# Patient Record
Sex: Female | Born: 1960 | Race: Black or African American | Hispanic: No | Marital: Single | Smoking: Never smoker
Health system: Southern US, Community
[De-identification: ages and names within clinical notes are randomized; demographics above are authoritative.]

## PROBLEM LIST (undated history)

## (undated) DIAGNOSIS — N83209 Unspecified ovarian cyst, unspecified side: Secondary | ICD-10-CM

## (undated) DIAGNOSIS — Z460 Encounter for fitting and adjustment of spectacles and contact lenses: Secondary | ICD-10-CM

## (undated) DIAGNOSIS — M549 Dorsalgia, unspecified: Secondary | ICD-10-CM

## (undated) DIAGNOSIS — D219 Benign neoplasm of connective and other soft tissue, unspecified: Secondary | ICD-10-CM

## (undated) DIAGNOSIS — N920 Excessive and frequent menstruation with regular cycle: Secondary | ICD-10-CM

## (undated) DIAGNOSIS — N840 Polyp of corpus uteri: Secondary | ICD-10-CM

## (undated) DIAGNOSIS — R5383 Other fatigue: Secondary | ICD-10-CM

## (undated) DIAGNOSIS — B009 Herpesviral infection, unspecified: Secondary | ICD-10-CM

## (undated) DIAGNOSIS — B9689 Other specified bacterial agents as the cause of diseases classified elsewhere: Secondary | ICD-10-CM

## (undated) DIAGNOSIS — N760 Acute vaginitis: Secondary | ICD-10-CM

## (undated) HISTORY — DX: Excessive and frequent menstruation with regular cycle: N92.0

## (undated) HISTORY — DX: Other specified bacterial agents as the cause of diseases classified elsewhere: N76.0

## (undated) HISTORY — DX: Dorsalgia, unspecified: M54.9

## (undated) HISTORY — DX: Acute vaginitis: B96.89

## (undated) HISTORY — DX: Encounter for fitting and adjustment of spectacles and contact lenses: Z46.0

## (undated) HISTORY — DX: Polyp of corpus uteri: N84.0

## (undated) HISTORY — DX: Other fatigue: R53.83

## (undated) HISTORY — DX: Herpesviral infection, unspecified: B00.9

## (undated) HISTORY — DX: Benign neoplasm of connective and other soft tissue, unspecified: D21.9

## (undated) HISTORY — PX: MYOMECTOMY: SHX85

## (undated) HISTORY — DX: Unspecified ovarian cyst, unspecified side: N83.209

---

## 1996-08-11 HISTORY — PX: DILATION AND CURETTAGE OF UTERUS: SHX78

## 1998-08-11 HISTORY — PX: ABDOMINAL HYSTERECTOMY: SHX81

## 1998-09-27 ENCOUNTER — Ambulatory Visit (HOSPITAL_COMMUNITY): Admission: RE | Admit: 1998-09-27 | Discharge: 1998-09-27 | Payer: Self-pay | Admitting: Obstetrics and Gynecology

## 1998-09-27 ENCOUNTER — Encounter: Payer: Self-pay | Admitting: Obstetrics and Gynecology

## 1999-04-22 ENCOUNTER — Inpatient Hospital Stay (HOSPITAL_COMMUNITY): Admission: RE | Admit: 1999-04-22 | Discharge: 1999-04-24 | Payer: Self-pay | Admitting: Obstetrics and Gynecology

## 2000-08-18 ENCOUNTER — Ambulatory Visit (HOSPITAL_BASED_OUTPATIENT_CLINIC_OR_DEPARTMENT_OTHER): Admission: RE | Admit: 2000-08-18 | Discharge: 2000-08-18 | Payer: Self-pay | Admitting: Surgery

## 2000-12-23 ENCOUNTER — Emergency Department (HOSPITAL_COMMUNITY): Admission: EM | Admit: 2000-12-23 | Discharge: 2000-12-23 | Payer: Self-pay | Admitting: Emergency Medicine

## 2000-12-23 ENCOUNTER — Encounter: Payer: Self-pay | Admitting: Internal Medicine

## 2001-10-06 ENCOUNTER — Other Ambulatory Visit: Admission: RE | Admit: 2001-10-06 | Discharge: 2001-10-06 | Payer: Self-pay | Admitting: Obstetrics and Gynecology

## 2002-06-07 ENCOUNTER — Ambulatory Visit (HOSPITAL_COMMUNITY): Admission: RE | Admit: 2002-06-07 | Discharge: 2002-06-07 | Payer: Self-pay | Admitting: Obstetrics and Gynecology

## 2002-06-07 ENCOUNTER — Encounter: Payer: Self-pay | Admitting: Obstetrics and Gynecology

## 2002-06-21 ENCOUNTER — Ambulatory Visit (HOSPITAL_COMMUNITY): Admission: RE | Admit: 2002-06-21 | Discharge: 2002-06-21 | Payer: Self-pay | Admitting: Obstetrics and Gynecology

## 2002-06-21 ENCOUNTER — Encounter: Payer: Self-pay | Admitting: Obstetrics and Gynecology

## 2002-09-26 ENCOUNTER — Other Ambulatory Visit: Admission: RE | Admit: 2002-09-26 | Discharge: 2002-09-26 | Payer: Self-pay | Admitting: Obstetrics and Gynecology

## 2002-11-17 ENCOUNTER — Encounter (INDEPENDENT_AMBULATORY_CARE_PROVIDER_SITE_OTHER): Payer: Self-pay | Admitting: *Deleted

## 2002-11-17 ENCOUNTER — Ambulatory Visit (HOSPITAL_COMMUNITY): Admission: RE | Admit: 2002-11-17 | Discharge: 2002-11-17 | Payer: Self-pay | Admitting: Obstetrics and Gynecology

## 2003-09-27 ENCOUNTER — Other Ambulatory Visit: Admission: RE | Admit: 2003-09-27 | Discharge: 2003-09-27 | Payer: Self-pay | Admitting: Obstetrics and Gynecology

## 2003-10-04 ENCOUNTER — Ambulatory Visit (HOSPITAL_COMMUNITY): Admission: RE | Admit: 2003-10-04 | Discharge: 2003-10-04 | Payer: Self-pay | Admitting: Obstetrics and Gynecology

## 2003-10-05 ENCOUNTER — Encounter: Admission: RE | Admit: 2003-10-05 | Discharge: 2003-10-05 | Payer: Self-pay | Admitting: Obstetrics and Gynecology

## 2003-10-10 ENCOUNTER — Ambulatory Visit (HOSPITAL_COMMUNITY): Admission: RE | Admit: 2003-10-10 | Discharge: 2003-10-10 | Payer: Self-pay | Admitting: Obstetrics and Gynecology

## 2003-11-21 ENCOUNTER — Encounter (INDEPENDENT_AMBULATORY_CARE_PROVIDER_SITE_OTHER): Payer: Self-pay | Admitting: *Deleted

## 2003-11-21 ENCOUNTER — Inpatient Hospital Stay (HOSPITAL_COMMUNITY): Admission: RE | Admit: 2003-11-21 | Discharge: 2003-11-23 | Payer: Self-pay | Admitting: Obstetrics and Gynecology

## 2004-04-11 ENCOUNTER — Encounter: Admission: RE | Admit: 2004-04-11 | Discharge: 2004-07-10 | Payer: Self-pay | Admitting: Family Medicine

## 2004-06-24 ENCOUNTER — Ambulatory Visit: Payer: Self-pay | Admitting: Family Medicine

## 2004-08-08 ENCOUNTER — Ambulatory Visit: Payer: Self-pay | Admitting: Family Medicine

## 2005-01-01 ENCOUNTER — Other Ambulatory Visit: Admission: RE | Admit: 2005-01-01 | Discharge: 2005-01-01 | Payer: Self-pay | Admitting: Obstetrics and Gynecology

## 2005-01-21 ENCOUNTER — Ambulatory Visit (HOSPITAL_COMMUNITY): Admission: RE | Admit: 2005-01-21 | Discharge: 2005-01-21 | Payer: Self-pay | Admitting: Obstetrics and Gynecology

## 2005-01-22 ENCOUNTER — Ambulatory Visit: Payer: Self-pay | Admitting: Family Medicine

## 2005-05-05 ENCOUNTER — Ambulatory Visit: Payer: Self-pay | Admitting: Family Medicine

## 2005-12-30 ENCOUNTER — Ambulatory Visit: Payer: Self-pay | Admitting: Family Medicine

## 2006-05-15 ENCOUNTER — Ambulatory Visit (HOSPITAL_COMMUNITY): Admission: RE | Admit: 2006-05-15 | Discharge: 2006-05-15 | Payer: Self-pay | Admitting: Obstetrics and Gynecology

## 2006-05-18 ENCOUNTER — Other Ambulatory Visit: Admission: RE | Admit: 2006-05-18 | Discharge: 2006-05-18 | Payer: Self-pay | Admitting: Obstetrics and Gynecology

## 2006-08-20 ENCOUNTER — Ambulatory Visit: Payer: Self-pay | Admitting: Family Medicine

## 2006-09-25 ENCOUNTER — Ambulatory Visit: Payer: Self-pay | Admitting: Family Medicine

## 2007-01-11 ENCOUNTER — Ambulatory Visit: Payer: Self-pay | Admitting: Internal Medicine

## 2007-01-11 DIAGNOSIS — L0292 Furuncle, unspecified: Secondary | ICD-10-CM | POA: Insufficient documentation

## 2007-01-11 DIAGNOSIS — L0293 Carbuncle, unspecified: Secondary | ICD-10-CM

## 2007-01-11 DIAGNOSIS — N760 Acute vaginitis: Secondary | ICD-10-CM | POA: Insufficient documentation

## 2007-09-24 ENCOUNTER — Ambulatory Visit (HOSPITAL_COMMUNITY): Admission: RE | Admit: 2007-09-24 | Discharge: 2007-09-24 | Payer: Self-pay | Admitting: Obstetrics and Gynecology

## 2008-01-19 ENCOUNTER — Ambulatory Visit: Payer: Self-pay | Admitting: Family Medicine

## 2008-01-19 DIAGNOSIS — K625 Hemorrhage of anus and rectum: Secondary | ICD-10-CM

## 2008-01-19 DIAGNOSIS — Z87898 Personal history of other specified conditions: Secondary | ICD-10-CM

## 2008-02-14 ENCOUNTER — Telehealth (INDEPENDENT_AMBULATORY_CARE_PROVIDER_SITE_OTHER): Payer: Self-pay | Admitting: *Deleted

## 2008-03-16 ENCOUNTER — Ambulatory Visit: Payer: Self-pay | Admitting: Family Medicine

## 2008-03-21 ENCOUNTER — Telehealth (INDEPENDENT_AMBULATORY_CARE_PROVIDER_SITE_OTHER): Payer: Self-pay | Admitting: *Deleted

## 2008-05-30 ENCOUNTER — Telehealth (INDEPENDENT_AMBULATORY_CARE_PROVIDER_SITE_OTHER): Payer: Self-pay | Admitting: *Deleted

## 2008-06-02 ENCOUNTER — Telehealth: Payer: Self-pay | Admitting: Family Medicine

## 2008-06-08 ENCOUNTER — Telehealth (INDEPENDENT_AMBULATORY_CARE_PROVIDER_SITE_OTHER): Payer: Self-pay | Admitting: *Deleted

## 2008-06-09 ENCOUNTER — Ambulatory Visit: Payer: Self-pay | Admitting: Family Medicine

## 2008-06-09 DIAGNOSIS — R030 Elevated blood-pressure reading, without diagnosis of hypertension: Secondary | ICD-10-CM | POA: Insufficient documentation

## 2008-06-12 ENCOUNTER — Telehealth (INDEPENDENT_AMBULATORY_CARE_PROVIDER_SITE_OTHER): Payer: Self-pay | Admitting: *Deleted

## 2008-06-14 ENCOUNTER — Encounter: Payer: Self-pay | Admitting: Family Medicine

## 2008-07-10 ENCOUNTER — Ambulatory Visit: Payer: Self-pay | Admitting: Family Medicine

## 2008-08-08 ENCOUNTER — Ambulatory Visit: Payer: Self-pay | Admitting: Family Medicine

## 2008-11-01 ENCOUNTER — Ambulatory Visit (HOSPITAL_COMMUNITY): Admission: RE | Admit: 2008-11-01 | Discharge: 2008-11-01 | Payer: Self-pay | Admitting: Obstetrics and Gynecology

## 2009-01-18 ENCOUNTER — Ambulatory Visit: Payer: Self-pay | Admitting: Family Medicine

## 2009-01-22 ENCOUNTER — Encounter (INDEPENDENT_AMBULATORY_CARE_PROVIDER_SITE_OTHER): Payer: Self-pay | Admitting: *Deleted

## 2009-02-07 ENCOUNTER — Ambulatory Visit: Payer: Self-pay | Admitting: Family Medicine

## 2009-02-07 LAB — CONVERTED CEMR LAB
OCCULT 2: NEGATIVE
OCCULT 3: NEGATIVE

## 2009-02-08 ENCOUNTER — Encounter (INDEPENDENT_AMBULATORY_CARE_PROVIDER_SITE_OTHER): Payer: Self-pay | Admitting: *Deleted

## 2009-04-24 ENCOUNTER — Ambulatory Visit: Payer: Self-pay | Admitting: Family Medicine

## 2009-04-24 DIAGNOSIS — K219 Gastro-esophageal reflux disease without esophagitis: Secondary | ICD-10-CM | POA: Insufficient documentation

## 2009-04-24 LAB — CONVERTED CEMR LAB
Blood in Urine, dipstick: NEGATIVE
Glucose, Urine, Semiquant: NEGATIVE
Ketones, urine, test strip: NEGATIVE
Nitrite: NEGATIVE
Specific Gravity, Urine: 1.02
pH: 6.5

## 2009-04-30 ENCOUNTER — Telehealth: Payer: Self-pay | Admitting: Family Medicine

## 2009-04-30 LAB — CONVERTED CEMR LAB: Chlamydia, DNA Probe: NEGATIVE

## 2009-12-31 ENCOUNTER — Ambulatory Visit (HOSPITAL_COMMUNITY): Admission: RE | Admit: 2009-12-31 | Discharge: 2009-12-31 | Payer: Self-pay | Admitting: Obstetrics and Gynecology

## 2010-01-04 ENCOUNTER — Encounter: Admission: RE | Admit: 2010-01-04 | Discharge: 2010-01-04 | Payer: Self-pay | Admitting: Obstetrics and Gynecology

## 2010-01-24 ENCOUNTER — Ambulatory Visit: Payer: Self-pay | Admitting: Family Medicine

## 2010-01-24 DIAGNOSIS — M545 Low back pain: Secondary | ICD-10-CM

## 2010-04-02 ENCOUNTER — Ambulatory Visit: Payer: Self-pay | Admitting: Family Medicine

## 2010-04-02 LAB — CONVERTED CEMR LAB
Bilirubin Urine: NEGATIVE
Ketones, urine, test strip: NEGATIVE
Nitrite: NEGATIVE
Urobilinogen, UA: 0.2

## 2010-04-03 ENCOUNTER — Telehealth (INDEPENDENT_AMBULATORY_CARE_PROVIDER_SITE_OTHER): Payer: Self-pay | Admitting: *Deleted

## 2010-04-03 LAB — CONVERTED CEMR LAB
Chlamydia, Swab/Urine, PCR: NEGATIVE
Gardnerella vaginalis: POSITIVE — AB

## 2010-04-16 ENCOUNTER — Ambulatory Visit: Payer: Self-pay | Admitting: Family Medicine

## 2010-04-23 ENCOUNTER — Encounter: Payer: Self-pay | Admitting: Family Medicine

## 2010-04-24 ENCOUNTER — Telehealth (INDEPENDENT_AMBULATORY_CARE_PROVIDER_SITE_OTHER): Payer: Self-pay | Admitting: *Deleted

## 2010-04-24 ENCOUNTER — Encounter: Payer: Self-pay | Admitting: Family Medicine

## 2010-05-08 ENCOUNTER — Telehealth (INDEPENDENT_AMBULATORY_CARE_PROVIDER_SITE_OTHER): Payer: Self-pay | Admitting: *Deleted

## 2010-05-13 ENCOUNTER — Encounter: Payer: Self-pay | Admitting: Family Medicine

## 2010-05-15 ENCOUNTER — Encounter: Payer: Self-pay | Admitting: Family Medicine

## 2010-05-16 ENCOUNTER — Encounter: Payer: Self-pay | Admitting: Family Medicine

## 2010-05-29 ENCOUNTER — Ambulatory Visit: Payer: Self-pay | Admitting: Family Medicine

## 2010-05-31 ENCOUNTER — Telehealth (INDEPENDENT_AMBULATORY_CARE_PROVIDER_SITE_OTHER): Payer: Self-pay | Admitting: *Deleted

## 2010-06-03 ENCOUNTER — Telehealth (INDEPENDENT_AMBULATORY_CARE_PROVIDER_SITE_OTHER): Payer: Self-pay | Admitting: *Deleted

## 2010-06-05 ENCOUNTER — Ambulatory Visit: Payer: Self-pay | Admitting: Family Medicine

## 2010-06-25 ENCOUNTER — Encounter: Admission: RE | Admit: 2010-06-25 | Discharge: 2010-06-25 | Payer: Self-pay | Admitting: Obstetrics and Gynecology

## 2010-06-28 ENCOUNTER — Encounter: Payer: Self-pay | Admitting: Family Medicine

## 2010-07-03 ENCOUNTER — Ambulatory Visit (HOSPITAL_COMMUNITY): Admission: RE | Admit: 2010-07-03 | Discharge: 2010-07-03 | Payer: Self-pay | Admitting: Surgery

## 2010-07-10 ENCOUNTER — Ambulatory Visit: Payer: Self-pay | Admitting: Family Medicine

## 2010-07-10 ENCOUNTER — Encounter
Admission: RE | Admit: 2010-07-10 | Discharge: 2010-09-10 | Payer: Self-pay | Source: Home / Self Care | Attending: Surgery | Admitting: Surgery

## 2010-07-11 ENCOUNTER — Ambulatory Visit (HOSPITAL_COMMUNITY)
Admission: RE | Admit: 2010-07-11 | Discharge: 2010-07-11 | Payer: Self-pay | Source: Home / Self Care | Admitting: Surgery

## 2010-07-22 ENCOUNTER — Ambulatory Visit (HOSPITAL_BASED_OUTPATIENT_CLINIC_OR_DEPARTMENT_OTHER)
Admission: RE | Admit: 2010-07-22 | Discharge: 2010-07-22 | Payer: Self-pay | Source: Home / Self Care | Attending: Surgery | Admitting: Surgery

## 2010-08-09 ENCOUNTER — Ambulatory Visit: Payer: Self-pay | Admitting: Family Medicine

## 2010-08-09 ENCOUNTER — Encounter
Admission: RE | Admit: 2010-08-09 | Discharge: 2010-09-10 | Payer: Self-pay | Source: Home / Self Care | Attending: General Surgery | Admitting: General Surgery

## 2010-08-31 ENCOUNTER — Encounter: Payer: Self-pay | Admitting: Obstetrics and Gynecology

## 2010-09-01 ENCOUNTER — Encounter: Payer: Self-pay | Admitting: Obstetrics and Gynecology

## 2010-09-08 LAB — CONVERTED CEMR LAB
AST: 20 units/L (ref 0–37)
AST: 20 units/L (ref 0–37)
Albumin: 3.6 g/dL (ref 3.5–5.2)
Albumin: 3.8 g/dL (ref 3.5–5.2)
Alkaline Phosphatase: 64 units/L (ref 39–117)
BUN: 10 mg/dL (ref 6–23)
Basophils Absolute: 0 10*3/uL (ref 0.0–0.1)
Basophils Absolute: 0 10*3/uL (ref 0.0–0.1)
Basophils Relative: 0.2 % (ref 0.0–1.0)
Bilirubin, Direct: 0.1 mg/dL (ref 0.0–0.3)
CO2: 29 meq/L (ref 19–32)
Calcium: 8.8 mg/dL (ref 8.4–10.5)
Chloride: 106 meq/L (ref 96–112)
Cholesterol: 178 mg/dL (ref 0–200)
Creatinine, Ser: 0.8 mg/dL (ref 0.4–1.2)
Creatinine, Ser: 0.8 mg/dL (ref 0.4–1.2)
Eosinophils Absolute: 0 10*3/uL (ref 0.0–0.7)
Eosinophils Absolute: 0.1 10*3/uL (ref 0.0–0.7)
GFR calc Af Amer: 100 mL/min
GFR calc non Af Amer: 83 mL/min
GFR calc non Af Amer: 99.38 mL/min (ref >60)
Glucose, Bld: 101 mg/dL — ABNORMAL HIGH (ref 70–99)
HCT: 35.5 % — ABNORMAL LOW (ref 36.0–46.0)
HDL: 46.4 mg/dL (ref >39.00)
HDL: 47 mg/dL (ref >39.0)
Hemoglobin: 12.9 g/dL (ref 12.0–15.0)
Lymphocytes Relative: 29.3 % (ref 12.0–46.0)
MCHC: 34.7 g/dL (ref 30.0–36.0)
MCHC: 34.7 g/dL (ref 30.0–36.0)
MCV: 94.1 fL (ref 78.0–100.0)
Monocytes Absolute: 0.4 10*3/uL (ref 0.1–1.0)
Monocytes Relative: 8.9 % (ref 3.0–12.0)
Neutro Abs: 3.8 10*3/uL (ref 1.4–7.7)
Neutrophils Relative %: 60.2 % (ref 43.0–77.0)
Neutrophils Relative %: 65.4 % (ref 43.0–77.0)
Pap Smear: NORMAL
Platelets: 315 10*3/uL (ref 150.0–400.0)
Platelets: 369 10*3/uL (ref 150–400)
Potassium: 4 meq/L (ref 3.5–5.1)
RDW: 13.1 % (ref 11.5–14.6)
Sodium: 141 meq/L (ref 135–145)
TSH: 1.9 microintl units/mL (ref 0.35–5.50)
Total Bilirubin: 0.6 mg/dL (ref 0.3–1.2)
Total Bilirubin: 0.9 mg/dL (ref 0.3–1.2)
Triglycerides: 35 mg/dL (ref 0–149)
Triglycerides: 58 mg/dL (ref 0.0–149.0)
VLDL: 11.6 mg/dL (ref 0.0–40.0)
VLDL: 7 mg/dL (ref 0–40)

## 2010-09-09 ENCOUNTER — Ambulatory Visit
Admission: RE | Admit: 2010-09-09 | Discharge: 2010-09-09 | Payer: Self-pay | Source: Home / Self Care | Attending: Family Medicine | Admitting: Family Medicine

## 2010-09-10 ENCOUNTER — Telehealth: Payer: Self-pay | Admitting: Family Medicine

## 2010-09-10 NOTE — Letter (Signed)
Summary: [facility]  [facility]   Imported By: Lanelle Bal 05/28/2010 12:59:25  _____________________________________________________________________  External Attachment:    Type:   Image     Comment:   External Document

## 2010-09-10 NOTE — Progress Notes (Signed)
Summary: Forms for surgery 10/21  Phone Note Outgoing Call   Call placed by: Almeta Monas CMA Duncan Dull),  May 31, 2010 3:37 PM Call placed to: Patient Details for Reason: Forms need to be filled out Summary of Call: Left message to call back.... Almeta Monas CMA Duncan Dull)  May 31, 2010 3:37 PM Per Dr.Lowne she can not write a letter for pt to ave surgery because we have not followed the pt for the last six months for weight loss. Initial call taken by: Almeta Monas CMA Duncan Dull),  May 31, 2010 3:38 PM  Follow-up for Phone Call        see new phone note Follow-up by: Almeta Monas CMA Duncan Dull),  June 03, 2010 3:18 PM

## 2010-09-10 NOTE — Letter (Signed)
Summary: [facility]   Imported By: Lanelle Bal 07/15/2010 14:58:49  _____________________________________________________________________  External Attachment:    Type:   Image     Comment:   External Document

## 2010-09-10 NOTE — Letter (Signed)
Summary: Weight Loss Forms/[facility]  Weight Loss Forms/[facility]   Imported By: Lanelle Bal 06/18/2010 13:13:09  _____________________________________________________________________  External Attachment:    Type:   Image     Comment:   External Document

## 2010-09-10 NOTE — Letter (Signed)
Summary: Orthopedic Exam/[facility]  [facility]   Imported By: Lanelle Bal 05/23/2010 09:31:06  _____________________________________________________________________  External Attachment:    Type:   Image     Comment:   External Document

## 2010-09-10 NOTE — Progress Notes (Signed)
Summary: lmtc 8-24  Phone Note Outgoing Call   Details for Reason: Chlamydia and GC Probe Amp, Urine--negative + Trich and BV----Metronidazole 500mg  1 by mouth two times a day for 7 days Summary of Call: left message to call office.............Marland KitchenFelecia Deloach CMA  April 03, 2010 11:19 AM     New/Updated Medications: METRONIDAZOLE 500 MG TABS (METRONIDAZOLE) 1 by mouth two times a day for 7 days Prescriptions: METRONIDAZOLE 500 MG TABS (METRONIDAZOLE) 1 by mouth two times a day for 7 days  #14 x 0   Entered by:   Almeta Monas CMA (AAMA)   Authorized by:   Loreen Freud DO   Signed by:   Almeta Monas CMA (AAMA) on 04/04/2010   Method used:   [facility]  [address] [id number]       [address]       [place], [place]  [address]       Ph: [phone] or [phone]       Fax: [phone]   RxID:   [id number]  Pt aware, [facility] [address]. Adv pt needs to be treated for Trich as well. Pt voiced understanding.              Almeta Monas CMA Duncan Dull)  April 04, 2010 8:13 AM

## 2010-09-10 NOTE — Assessment & Plan Note (Signed)
Summary: back pain//lch   Vital Signs:  Patient profile:   50 year old female Height:      63 inches Weight:      237 pounds BMI:     42.13 Pulse rate:   84 / minute Pulse rhythm:   regular BP sitting:   130 / 80  (left arm) Cuff size:   large  Vitals Entered By: Army Fossa CMA (January 24, 2010 1:52 PM) CC: Pt got up real fast this am felt a catch and her lower mid back is in a lot of pain now., Back Pain   History of Present Illness:       This is a 50 year old woman who presents with Back Pain.  The symptoms began 8-12 hrs ago.  Pt got out of bed this am and felt sharp pain low back.  The patient denies fever, chills, weakness, loss of sensation, fecal incontinence, urinary incontinence, urinary retention, dysuria, rest pain, inability to work, and inability to care for self.  The pain is located in the mid low back.  The pain began at home and suddenly.  The pain is made worse by standing or walking and flexion.    Current Medications (verified): 1)  Valtrex 500 Mg  Tabs (Valacyclovir Hcl) .... Take One Tablet Daily 2)  Flexeril 10 Mg Tabs (Cyclobenzaprine Hcl) .Marland Kitchen.. 1 By Mouth Three Times A Day As Needed 3)  Vicodin Es 7.5-750 Mg Tabs (Hydrocodone-Acetaminophen) .Marland Kitchen.. 1 By Mouth Q6h As Needed  Allergies (verified): No Known Drug Allergies  Past History:  Past medical, surgical, family and social histories (including risk factors) reviewed for relevance to current acute and chronic problems.  Past Medical History: Reviewed history from 01/19/2008 and no changes required. 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 Hysterectomy 4/05 myomectomy 05/1999 Miscarriage 98 and 71  Family History: Reviewed history from 01/19/2008 and no  changes required. Family History Hypertension Family History of CAD Female 1st degree relative at 48yo Family History of Stroke F 1st degree relative <60 MGM--DM PGM--DM MGF--DM  Social History: Reviewed history from 01/19/2008 and no changes required. Occupation: [employer] Divorced Never Smoked Alcohol use-yes Drug use-no Regular exercise-yes  Review of Systems      See HPI  Physical Exam  General:  Well-developed,well-nourished,in no acute distress; alert,appropriate and cooperative throughout examination Neurologic:  strength normal in all extremities, gait normal, and DTRs symmetrical and normal.  + SLR b/l pain with forward bending ---pain better with back bending Psych:  Oriented X3 and normally interactive.     Impression & Recommendations:  Problem # 1:  LOW BACK PAIN, ACUTE (ICD-724.2)  Her updated medication list for this problem includes:    Flexeril 10 Mg Tabs (Cyclobenzaprine hcl) .Marland Kitchen... 1 by mouth three times a day as needed    Vicodin Es 7.5-750 Mg Tabs (Hydrocodone-acetaminophen) .Marland Kitchen... 1 by mouth q6h as needed  Discussed use of moist heat or ice, modified activities, medications, and stretching/strengthening exercises. Back care instructions given. To be seen in 2 weeks if no improvement; sooner if worsening of symptoms.   Complete Medication List: 1)  Valtrex 500 Mg Tabs (Valacyclovir hcl) .... Take one tablet daily 2)  Flexeril 10 Mg Tabs (Cyclobenzaprine hcl) .Marland Kitchen.. 1 by mouth three times a day as needed 3)  Vicodin Es 7.5-750 Mg  Tabs (Hydrocodone-acetaminophen) .Marland Kitchen.. 1 by mouth q6h as needed Prescriptions: VICODIN ES 7.5-750 MG TABS (HYDROCODONE-ACETAMINOPHEN) 1 by mouth q6h as needed  #60 x 0   Entered and Authorized by:   Loreen Freud DO   Signed by:   Loreen Freud DO on 01/24/2010   Method used:   Print then Give to Patient   RxID:   [id number] FLEXERIL 10 MG TABS (CYCLOBENZAPRINE HCL) 1 by mouth three times a day as needed  #30 x 0   Entered and  Authorized by:   Loreen Freud DO   Signed by:   Loreen Freud DO on 01/24/2010   Method used:   Print then Give to Patient   RxID:   [id number]

## 2010-09-10 NOTE — Progress Notes (Signed)
Summary: Information about Forms  Phone Note Outgoing Call   Call placed by: Almeta Monas CMA Duncan Dull),  June 03, 2010 8:37 AM Call placed to: Patient Details for Reason: Forms for surgery Summary of Call: spk with pt and I advised per paper work can not be signed, she needed to be followed for 6 months before we can sign documents. Pt stated this is the first step, the letter and documents is to show that she had been trying in the past and need for her to qualify for the program. she said once we sign the paperwork she will start the program and will have initial visit with them, they will send her back to [place] and she will be seen by Dr.Lowne  who will follow her for the next few months and then they will proceed with the surgery. Pt can't start untill Bariatric ppl give her the initial visit and she can't get the initial visit until Dr.Lowne sends the paperwork and a letter proving she has tried in the past to loose weight. c/b # [id number].  Initial call taken by: Almeta Monas CMA Duncan Dull),  June 03, 2010 8:44 AM  Follow-up for Phone Call        I don't have enough info to do a letter---the only weight loss attempt I have in chart is adipex for 5 months with no weight loss---  that is not enough for ins to pay for surgery---I can do letter but it will not be enough for ins---if she has done a lot more she really needs to discuss this with me in an ov so I can put it all in a letter. Follow-up by: Loreen Freud DO,  June 03, 2010 3:14 PM  Additional Follow-up for Phone Call Additional follow up Details #1::        Left message to call back..... Almeta Monas CMA Duncan Dull)  June 03, 2010 3:23 PM     Additional Follow-up for Phone Call Additional follow up Details #2::    pt aware, appt schedule for tomorrow morning.... Almeta Monas CMA Duncan Dull)  June 04, 2010 12:00 PM  Follow-up by: Almeta Monas CMA Duncan Dull),  June 04, 2010 12:01 PM

## 2010-09-10 NOTE — Progress Notes (Signed)
Summary: Triage; Ongoing Pain  Phone Note Call from Patient Call back at [phone]   Caller: Patient Summary of Call: Patient called and indicated that she called spoke with someone on Monday and indicated that she is no better. Patient said she was told it will take time for her to get better and was also told that her PT referral was in process. Patient called today to inform Dr.Lowne she is no better and still has not heard anything about her referral. Patient set up her own appointment with a chiropractor, they ordered xray of her back, patient will see them today to get results (patient informed to request they send report to Dr.Lowne)  Luster Landsberg (referral coor) informed to f/u on referral**  [facility] [address]  Initial call taken by: Shonna Chock CMA,  April 24, 2010 9:12 AM    Prescriptions: FLEXERIL 10 MG TABS (CYCLOBENZAPRINE HCL) 1 by mouth three times a day as needed  #30 x 0   Entered by:   Shonna Chock CMA   Authorized by:   Loreen Freud DO   Signed by:   Shonna Chock CMA on 04/24/2010   Method used:   [facility]  [address] [id number]       [address]       [place], [place]  [address]       Ph: [phone] or [phone]       Fax: [phone]   RxID:   [id number]

## 2010-09-10 NOTE — Progress Notes (Signed)
Summary: Info Brought by Patient Regarding Weight Loss Program  Info Brought by Patient Regarding Weight Loss Program   Imported By: Lanelle Bal 06/12/2010 12:59:21  _____________________________________________________________________  External Attachment:    Type:   Image     Comment:   External Document

## 2010-09-10 NOTE — Letter (Signed)
Summary: [facility]  [facility]   Imported By: Lanelle Bal 05/28/2010 13:00:08  _____________________________________________________________________  External Attachment:    Type:   Image     Comment:   External Document

## 2010-09-10 NOTE — Assessment & Plan Note (Signed)
Summary: EXTREME BACK PAIN/RH......Susan Lamb   Vital Signs:  Patient profile:   50 year old female Weight:      242.6 pounds Temp:     98.7 degrees F oral Pulse rate:   80 / minute Pulse rhythm:   regular BP sitting:   124 / 76  (left arm)  Vitals Entered By: Almeta Monas CMA Duncan Dull) (April 16, 2010 1:32 PM) CC: c/o of severe back pain getting worse, Back Pain   History of Present Illness:       This is a 50 year old woman who presents with Back Pain.  The symptoms began 3 weeks ago.  Pt had an episode of back pain---no known injury.  Pt pain radiates down R leg to knee.  The patient denies fever, chills, weakness, loss of sensation, fecal incontinence, urinary incontinence, urinary retention, dysuria, rest pain, inability to work, and inability to care for self.  The pain is located in the right low back.  The pain began at home and gradually.  The pain radiates to the right leg below the knee.  The pain is made worse by lying down and activity.  The pain is made better by inactivity.    Current Medications (verified): 1)  Valtrex 500 Mg  Tabs (Valacyclovir Hcl) .... Take One Tablet Daily 2)  Fluconazole 150 Mg Tabs (Fluconazole) .Susan Lamb.. 1 By Mouth Once Daily X1  --May Repeat in 3 Days As Needed 3)  Metronidazole 500 Mg Tabs (Metronidazole) .Susan Lamb.. 1 By Mouth Two Times A Day For 7 Days 4)  Vicodin Es 7.5-750 Mg Tabs (Hydrocodone-Acetaminophen) .Susan Lamb.. 1 By Mouth Q6h As Needed 5)  Flexeril 10 Mg Tabs (Cyclobenzaprine Hcl) .Susan Lamb.. 1 By Mouth Three Times A Day As Needed 6)  Prednisone 10 Mg Tabs (Prednisone) .... 3 By Mouth Once Daily For 3 Days Then 2 By Mouth Once Daily For 3 Days Then 1 By Mouth Once Daily For 3 Days  Allergies (verified): No Known Drug Allergies  Past History:  Past medical, surgical, family and social histories (including risk factors) reviewed for relevance to current acute and chronic problems.  Past Medical History: Reviewed history from 01/19/2008 and no changes  required. 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 Hysterectomy 4/05 myomectomy 05/1999 Miscarriage 98 and 31  Family History: Reviewed history from 01/19/2008 and no changes required. Family History Hypertension Family History of CAD Female 1st degree relative at 48yo Family History of Stroke F 1st degree relative <60 MGM--DM PGM--DM MGF--DM  Social History: Reviewed history from 01/19/2008 and no changes required. Occupation: [employer] Divorced Never Smoked Alcohol use-yes Drug use-no Regular exercise-yes  Review of Systems      See HPI  Physical Exam  General:  Well-developed,well-nourished,in no acute distress; alert,appropriate and cooperative throughout examination Extremities:  No clubbing, cyanosis, edema, or deformity noted with normal full range of motion of all joints.   Neurologic:  alert & oriented X3, cranial nerves II-XII intact, strength normal in all extremities, gait normal, and DTRs symmetrical and normal.   Skin:  Intact without suspicious lesions or rashes Psych:  Cognition and judgment appear intact. Alert and cooperative with normal attention span and concentration. No apparent delusions, illusions, hallucinations   Impression & Recommendations:  Problem # 1:  LOW BACK PAIN, ACUTE (ICD-724.2)  Her updated medication list for  this problem includes:    Vicodin Es 7.5-750 Mg Tabs (Hydrocodone-acetaminophen) .Susan Lamb... 1 by mouth q6h as needed    Flexeril 10 Mg Tabs (Cyclobenzaprine hcl) .Susan Lamb... 1 by mouth three times a day as needed  Orders: Physical Therapy Referral (PT)  Discussed use of moist heat or ice, modified activities, medications, and stretching/strengthening exercises. Back care instructions given. To be seen in  2 weeks if no improvement; sooner if worsening of symptoms.   Complete Medication List: 1)  Valtrex 500 Mg Tabs (Valacyclovir hcl) .... Take one tablet daily 2)  Fluconazole 150 Mg Tabs (Fluconazole) .Susan Lamb.. 1 by mouth once daily x1  --may repeat in 3 days as needed 3)  Metronidazole 500 Mg Tabs (Metronidazole) .Susan Lamb.. 1 by mouth two times a day for 7 days 4)  Vicodin Es 7.5-750 Mg Tabs (Hydrocodone-acetaminophen) .Susan Lamb.. 1 by mouth q6h as needed 5)  Flexeril 10 Mg Tabs (Cyclobenzaprine hcl) .Susan Lamb.. 1 by mouth three times a day as needed 6)  Prednisone 10 Mg Tabs (Prednisone) .... 3 by mouth once daily for 3 days then 2 by mouth once daily for 3 days then 1 by mouth once daily for 3 days Prescriptions: PREDNISONE 10 MG TABS (PREDNISONE) 3 by mouth once daily for 3 days then 2 by mouth once daily for 3 days then 1 by mouth once daily for 3 days  #18 x 0   Entered and Authorized by:   Loreen Freud DO   Signed by:   Loreen Freud DO on 04/16/2010   Method used:   [facility]  [address] [id number]       [address]       [place], [place]  [address]       Ph: [phone] or [phone]       Fax: [phone]   RxID:   [id number] FLEXERIL 10 MG TABS (CYCLOBENZAPRINE HCL) 1 by mouth three times a day as needed  #30 x 0   Entered and Authorized by:   Loreen Freud DO   Signed by:   Loreen Freud DO on 04/16/2010   Method used:   [facility]        [facility]  [address] [id number]       [address]       [place], [place]  [address]       Ph: [phone] or [phone]       Fax: [phone]   RxID:   [id number] VICODIN ES 7.5-750 MG TABS (HYDROCODONE-ACETAMINOPHEN) 1 by mouth q6h as needed  #30 x 0   Entered and Authorized by:   Loreen Freud DO   Signed by:   Loreen Freud DO on 04/16/2010   Method used:   Print then Give to Patient   RxID:   [id number]

## 2010-09-10 NOTE — Assessment & Plan Note (Signed)
Summary: weight check/cbs   Vital Signs:  Patient profile:   50 year old female Weight:      251.2 pounds BMI:     44.66 Pulse rate:   76 / minute Pulse rhythm:   regular BP sitting:   130 / 80  (left arm) Cuff size:   large  Vitals Entered By: Almeta Monas CMA Duncan Dull) (July 10, 2010 9:36 AM) CC: weight check-- needs refills on muscle relaxer   History of Present Illness: Pt here for weight check ---she had boots on and did not want to remove them to weigh.  Pt weighed at home 249.6 with underwear.   Pt started back with trainer on Monday and went to nutritionist this am.  Pt was given pre op diet.      Problems Prior to Update: 1)  Low Back Pain, Acute  (ICD-724.2) 2)  Gerd  (ICD-530.81) 3)  Vaginitis  (ICD-616.10) 4)  Elevated Bp Reading Without Dx Hypertension  (ICD-796.2) 5)  Morbid Obesity  (ICD-278.01) 6)  Rectal Bleeding  (ICD-569.3) 7)  Preventive Health Care  (ICD-V70.0) 8)  Family History of Cad Female 1st Degree Relative <50  (ICD-V17.3) 9)  Genital Herpes, Hx of  (ICD-V13.8) 10)  Boils, Recurrent  (ICD-680.9) 11)  Vaginitis Nos  (ICD-616.10) 12)  Fh of Hx, Family, Diabetes Mellitus  (ICD-V18.0) 13)  Hx, Family, Stroke  (ICD-V17.1)  Medications Prior to Update: 1)  Valtrex 500 Mg  Tabs (Valacyclovir Hcl) .... Take One Tablet Daily 2)  Vicodin Es 7.5-750 Mg Tabs (Hydrocodone-Acetaminophen) .Marland Kitchen.. 1 By Mouth Q6h As Needed 3)  Flexeril 10 Mg Tabs (Cyclobenzaprine Hcl) .Marland Kitchen.. 1 By Mouth Three Times A Day As Needed  Current Medications (verified): 1)  Valtrex 500 Mg  Tabs (Valacyclovir Hcl) .... Take One Tablet Daily 2)  Vicodin Es 7.5-750 Mg Tabs (Hydrocodone-Acetaminophen) .Marland Kitchen.. 1 By Mouth Q6h As Needed 3)  Flexeril 10 Mg Tabs (Cyclobenzaprine Hcl) .Marland Kitchen.. 1 By Mouth Three Times A Day As Needed  Allergies (verified): No Known Drug Allergies  Past History:  Family History: Last updated: 01/19/2008 Family History Hypertension Family History of CAD Female 1st degree  relative at 48yo Family History of Stroke F 1st degree relative <60 MGM--DM PGM--DM MGF--DM  Social History: Last updated: 01/19/2008 Occupation: Community education officer Divorced Never Smoked Alcohol use-yes Drug use-no Regular exercise-yes  Risk Factors: Alcohol Use: <1 (01/18/2009) Caffeine Use: 0 (01/18/2009) Diet: trying to eat healthy (01/18/2009) Exercise: yes (01/18/2009)  Risk Factors: Smoking Status: never (01/18/2009) Passive Smoke Exposure: no (01/19/2008)  Past medical, surgical, family and social histories (including risk factors) reviewed for relevance to current acute and chronic problems.  Past Medical History: Reviewed history from 01/19/2008 and no changes required. 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 Hysterectomy 4/05 myomectomy 05/1999 Miscarriage 98 and 89  Family History: Reviewed history from 01/19/2008 and no changes required. Family History Hypertension Family History of CAD Female 1st degree relative at 48yo Family History of Stroke F 1st degree relative <60 MGM--DM PGM--DM MGF--DM  Social History: Reviewed history from 01/19/2008 and no changes required. Occupation: [employer] Divorced Never Smoked Alcohol use-yes Drug use-no Regular exercise-yes  Review of Systems      See HPI  Physical Exam  General:  Well-developed,well-nourished,in no acute distress; alert,appropriate and cooperative throughout examination Lungs:  Normal respiratory effort, chest  expands symmetrically. Lungs are clear to auscultation, no crackles or wheezes. Heart:  normal rate and no murmur.   Extremities:  No clubbing, cyanosis, edema, or deformity noted with normal full range of motion of all joints.   Psych:  Oriented X3 and normally  interactive.     Impression & Recommendations:  Problem # 1:  MORBID OBESITY (ICD-278.01) Pt is to con't with nutritionist --low carb , high protein diet and con't with trainer---get back into exercising pt needs 1 more visit here before surgery can be scheduled  Ht: 63 (06/05/2010)   Wt: 251.2 (07/10/2010)   BMI: 44.66 (07/10/2010)  Problem # 2:  LOW BACK PAIN, ACUTE (ICD-724.2)  Her updated medication list for this problem includes:    Vicodin Es 7.5-750 Mg Tabs (Hydrocodone-acetaminophen) .Marland Kitchen... 1 by mouth q6h as needed    Flexeril 10 Mg Tabs (Cyclobenzaprine hcl) .Marland Kitchen... 1 by mouth three times a day as needed  Discussed use of moist heat or ice, modified activities, medications, and stretching/strengthening exercises. Back care instructions given. To be seen in 2 weeks if no improvement; sooner if worsening of symptoms.   Complete Medication List: 1)  Valtrex 500 Mg Tabs (Valacyclovir hcl) .... Take one tablet daily 2)  Vicodin Es 7.5-750 Mg Tabs (Hydrocodone-acetaminophen) .Marland Kitchen.. 1 by mouth q6h as needed 3)  Flexeril 10 Mg Tabs (Cyclobenzaprine hcl) .Marland Kitchen.. 1 by mouth three times a day as needed Prescriptions: FLEXERIL 10 MG TABS (CYCLOBENZAPRINE HCL) 1 by mouth three times a day as needed  #30 x 0   Entered and Authorized by:   Loreen Freud DO   Signed by:   Loreen Freud DO on 07/10/2010   Method used:   [facility]  [address] [id number]       [address]       [place], [place]  [address]       Ph: [phone] or [phone]       Fax: [phone]   RxID:   [id number]    Orders Added: 1)  Est. Patient Level III [phone]

## 2010-09-10 NOTE — Letter (Signed)
Summary: [facility]  [facility]   Imported By: Lanelle Bal 05/23/2010 09:31:58  _____________________________________________________________________  External Attachment:    Type:   Image     Comment:   External Document

## 2010-09-10 NOTE — Progress Notes (Signed)
Summary: refill on flexeril  Phone Note Call from Patient Call back at [phone]   Caller: Patient Summary of Call: patient left msg on voicemail that she has been going to chiropractor for 2 weeks and she still has some back but leg is really painful would like to have refill on flexeril  Initial call taken by: Doristine Devoid CMA,  May 08, 2010 9:41 AM  Follow-up for Phone Call        left message on machine ...Marland KitchenMarland KitchenDoristine Devoid CMA  May 08, 2010 9:47 AM   spoke w/ patient says that she has been going to chiropractor and hasn't been to physical therapy since she is still currently under their care will have them fax over information from her visits and prescription to be sent to pharmacy.....Marland KitchenMarland KitchenDoristine Devoid CMA  May 10, 2010 1:21 PM     Prescriptions: FLEXERIL 10 MG TABS (CYCLOBENZAPRINE HCL) 1 by mouth three times a day as needed  #30 x 0   Entered by:   Doristine Devoid CMA   Authorized by:   Loreen Freud DO   Signed by:   Doristine Devoid CMA on 05/10/2010   Method used:   [facility]  [address] [id number]       [address]       [place], [place]  [address]       Ph: [phone] or [phone]       Fax: [phone]   RxID:   [id number]

## 2010-09-10 NOTE — Letter (Signed)
Summary: Generic Letter  [facility]  [address] [place], [place] [address]   Phone: [phone]  Fax: [phone]    06/05/2010  RE: DAVAN HARK DOB 06/05/1961 [address] [place], [place]  [address]  Dear Dr Daphine Deutscher,  Chloey Ricard is a [phone] patient of mine that is interested in the Lap Band surgery.  She has researched the surgery and has been contemplating it for several years.  She has tried several diets including Alli, Adipex, Acai berry supplements and  Metabolism boosters with no success.  Most recently she has been working with a Land and has been following a low carb, high protein diet and working out 4-5 x a week and has not lost any weight.  Leilani is aware of the life style changes needed before and after surgery and understands this is a lifetime change.  She understands the risks involved.   I believe she would make an excellent candidate for the lap band surgery.  Please feel free to call with any further questions.           Sincerely,   Loreen Freud DO  Appended Document: Generic Letter faxed to [facility] at [phone]

## 2010-09-10 NOTE — Assessment & Plan Note (Signed)
Summary: CONSULT//PH   Vital Signs:  Patient profile:   50 year old female Height:      63 inches Weight:      248.4 pounds BMI:     44.16 Pulse rate:   76 / minute Pulse rhythm:   regular BP sitting:   118 / 80  (right arm) Cuff size:   large  Vitals Entered By: Almeta Monas CMA Duncan Dull) (June 05, 2010 8:24 AM)   History of Present Illness: Pt here to discuss weight loss surgery --pt is waiting to have Lap Band surgery with Dr Daphine Deutscher.  Her trainer has her on a low carb , high protein diet and exercising 4-5 x a week.    Current Medications (verified): 1)  Valtrex 500 Mg  Tabs (Valacyclovir Hcl) .... Take One Tablet Daily 2)  Vicodin Es 7.5-750 Mg Tabs (Hydrocodone-Acetaminophen) .Marland Kitchen.. 1 By Mouth Q6h As Needed 3)  Flexeril 10 Mg Tabs (Cyclobenzaprine Hcl) .Marland Kitchen.. 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) VAGINITIS NOS (ICD-616.10) Family Hx of HX, FAMILY, DIABETES MELLITUS (ICD-V18.0) HX, FAMILY, STROKE (ICD-V17.1)  Past Surgical History: Last updated: 01/19/2008 Hysterectomy 4/05 myomectomy 05/1999 Miscarriage 98 and 99  Family History: Last updated: 01/19/2008 Family History Hypertension Family History of CAD Female 1st degree relative at 48yo Family History of Stroke F 1st degree relative <60 MGM--DM PGM--DM MGF--DM  Social History: Last updated: 01/19/2008 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 Family  History Hypertension Family History of CAD Female 1st degree relative at 48yo Family History of Stroke F 1st degree relative <60 MGM--DM PGM--DM MGF--DM  Social History: Reviewed history from 01/19/2008 and no changes required. Occupation: [employer] Divorced Never Smoked Alcohol use-yes Drug use-no Regular exercise-yes  Review of Systems      See HPI  Physical Exam  General:  Well-developed,well-nourished,in no acute distress; alert,appropriate and cooperative throughout examinationoverweight-appearing.   Lungs:  Normal respiratory effort, chest expands symmetrically. Lungs are clear to auscultation, no crackles or wheezes. Heart:  Normal rate and regular rhythm. S1 and S2 normal without gallop, murmur, click, rub or other extra sounds. Extremities:  No clubbing, cyanosis, edema, or deformity noted with normal full range of motion of all joints.   Psych:  Oriented X3, normally interactive, and good eye contact.     Impression & Recommendations:  Problem # 1:  MORBID OBESITY (ICD-278.01) dW pt diet and exercise---con't with trainer and nutritionist rto 1 month Ht: 63 (06/05/2010)   Wt: 248.4 (06/05/2010)   BMI: 44.16 (06/05/2010)  Complete Medication List: 1)  Valtrex 500 Mg Tabs (Valacyclovir hcl) .... Take one tablet daily 2)  Vicodin Es 7.5-750 Mg Tabs (Hydrocodone-acetaminophen) .Marland Kitchen.. 1 by mouth q6h as needed 3)  Flexeril 10 Mg Tabs (Cyclobenzaprine hcl) .Marland Kitchen.. 1 by mouth three times a day as needed  Patient Instructions: 1)  Please schedule a follow-up appointment in 1 month.    Orders Added: 1)  Est. Patient Level III [72536]

## 2010-09-10 NOTE — Letter (Signed)
Summary: [facility]  [facility]   Imported By: Lanelle Bal 05/23/2010 09:32:38  _____________________________________________________________________  External Attachment:    Type:   Image     Comment:   External Document

## 2010-09-10 NOTE — Assessment & Plan Note (Signed)
Summary: FEMALE PROBLEM--DISCHARGE, ODOR///SPH   Vital Signs:  Patient profile:   50 year old female Weight:      240 pounds Temp:     98.4 degrees F oral BP sitting:   136 / 98  (left arm)  Vitals Entered By: Almeta Monas CMA Duncan Dull) (April 02, 2010 11:46 AM) CC: c/o yellow vag discharge with some itching x1wk, Vaginal discharge   History of Present Illness:  Vaginal discharge      This is a 50 year old woman who presents with Vaginal discharge.  The symptoms began 2 weeks ago.  Pt c/o thin white d/c --no odor for more than 2 weeks---pt saw gyn and nothing was found.  The patient complains of itching, but denies vaginal burning, burning on urination, frequency, urgency, fever, pelvic pain, and back pain.  The discharge is described as white.  The patient denies the following symptoms: genital sores, unusual vaginal bleeding, painful intercourse, rash, myalgias, arthralgias, and headache.  Prior treatment has included OTC antifungals.    Current Medications (verified): 1)  Valtrex 500 Mg  Tabs (Valacyclovir Hcl) .... Take One Tablet Daily 2)  Fluconazole 150 Mg Tabs (Fluconazole) .Marland Kitchen.. 1 By Mouth Once Daily X1  --May Repeat in 3 Days As Needed  Allergies (verified): No Known Drug Allergies  Past History:  Past medical, surgical, family and social histories (including risk factors) reviewed for relevance to current acute and chronic problems.  Past Medical History: Reviewed history from 01/19/2008 and no changes required. 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 Hysterectomy 4/05 myomectomy 05/1999 Miscarriage 98 and 12  Family History: Reviewed history from 01/19/2008 and no changes required. Family History Hypertension Family History  of CAD Female 1st degree relative at 48yo Family History of Stroke F 1st degree relative <60 MGM--DM PGM--DM MGF--DM  Social History: Reviewed history from 01/19/2008 and no changes required. Occupation: [employer] Divorced Never Smoked Alcohol use-yes Drug use-no Regular exercise-yes  Review of Systems      See HPI  Physical Exam  General:  Well-developed,well-nourished,in no acute distress; alert,appropriate and cooperative throughout examination Abdomen:  Bowel sounds positive,abdomen soft and non-tender without masses, organomegaly or hernias noted. Genitalia:  normal introitus, no external lesions, and vaginal discharge.  -- thick white, no odor Psych:  Oriented X3, normally interactive, good eye contact, not anxious appearing, and not depressed appearing.     Impression & Recommendations:  Problem # 1:  VAGINITIS (ICD-616.10) diflucan 150 mg 1 po x1  Orders: T-Chlamydia  Probe, urine (35573-22025) T-GC Probe, urine (42706-23762) TLB-Wet [place] / Fungus (87210-WPREP) UA Dipstick w/o Micro (manual) (83151)  Discussed symptomatic relief and treatment options.   Complete Medication List: 1)  Valtrex 500 Mg Tabs (Valacyclovir hcl) .... Take one tablet daily 2)  Fluconazole 150 Mg Tabs (Fluconazole) .Marland Kitchen.. 1 by mouth once daily x1  --may repeat in 3 days as needed Prescriptions: FLUCONAZOLE 150 MG TABS (FLUCONAZOLE) 1 by mouth once daily x1  --may repeat in 3 days as needed  #2 x 0   Entered and Authorized by:   Loreen Freud DO   Signed by:   Loreen Freud DO on 04/02/2010   Method used:   [facility]  [address] [id number]       [address]  [place], [place]  [address]       Ph: [phone] or [phone]       Fax: [phone]   RxID:   [id number]   Laboratory Results   Urine Tests   Date/Time Reported: April 02, 2010 12:30 PM  Routine Urinalysis   Appearance: Clear Glucose: negative   (Normal Range:  Negative) Bilirubin: negative   (Normal Range: Negative) Ketone: negative   (Normal Range: Negative) Spec. Gravity: 1.025   (Normal Range: 1.003-1.035) Blood: negative   (Normal Range: Negative) pH: 6.5   (Normal Range: 5.0-8.0) Protein: trace   (Normal Range: Negative) Urobilinogen: 0.2   (Normal Range: 0-1) Nitrite: negative   (Normal Range: Negative) Leukocyte Esterace: negative   (Normal Range: Negative)    Urine HCG: negative

## 2010-09-10 NOTE — Letter (Signed)
Summary: [facility]  [facility]   Imported By: Lanelle Bal 05/23/2010 09:35:02  _____________________________________________________________________  External Attachment:    Type:   Image     Comment:   External Document

## 2010-09-12 NOTE — Assessment & Plan Note (Signed)
Summary: weight check/cbs   Vital Signs:  Patient profile:   50 year old female Weight:      249.4 pounds Temp:     98.0 degrees F oral Pulse rate:   76 / minute Pulse rhythm:   regular BP sitting:   124 / 76  (left arm) Cuff size:   large  Vitals Entered By: Almeta Monas CMA Duncan Dull) (August 09, 2010 9:42 AM) CC: weight check   History of Present Illness: Pt here for weight check.  Pt is following low carb , low caffeine diet --she is also not eating after 8pm.  Pt is exercising with trainer 3x a week.  Next week she will start with 4x a week.   Pt back is still bothering her--started in August.  She is seeing chiropractor and is doing exercises.  She says pain is now bearable but not gone.  No known injury.    Problems Prior to Update: 1)  Low Back Pain, Acute  (ICD-724.2) 2)  Gerd  (ICD-530.81) 3)  Vaginitis  (ICD-616.10) 4)  Elevated Bp Reading Without Dx Hypertension  (ICD-796.2) 5)  Morbid Obesity  (ICD-278.01) 6)  Rectal Bleeding  (ICD-569.3) 7)  Preventive Health Care  (ICD-V70.0) 8)  Family History of Cad Female 1st Degree Relative <50  (ICD-V17.3) 9)  Genital Herpes, Hx of  (ICD-V13.8) 10)  Boils, Recurrent  (ICD-680.9) 11)  Vaginitis Nos  (ICD-616.10) 12)  Fh of Hx, Family, Diabetes Mellitus  (ICD-V18.0) 13)  Hx, Family, Stroke  (ICD-V17.1)  Medications Prior to Update: 1)  Valtrex 500 Mg  Tabs (Valacyclovir Hcl) .... Take One Tablet Daily 2)  Vicodin Es 7.5-750 Mg Tabs (Hydrocodone-Acetaminophen) .Marland Kitchen.. 1 By Mouth Q6h As Needed 3)  Flexeril 10 Mg Tabs (Cyclobenzaprine Hcl) .Marland Kitchen.. 1 By Mouth Three Times A Day As Needed  Current Medications (verified): 1)  Valtrex 500 Mg  Tabs (Valacyclovir Hcl) .... Take One Tablet Daily 2)  Vicodin Es 7.5-750 Mg Tabs (Hydrocodone-Acetaminophen) .Marland Kitchen.. 1 By Mouth Q6h As Needed 3)  Flexeril 10 Mg Tabs (Cyclobenzaprine Hcl) .Marland Kitchen.. 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) VAGINITIS NOS (ICD-616.10) Family Hx of HX, FAMILY, DIABETES MELLITUS (ICD-V18.0) HX, FAMILY, STROKE (ICD-V17.1)  Past Surgical History: Last updated: 01/19/2008 Hysterectomy 4/05 myomectomy 05/1999 Miscarriage 98 and 99  Family History: Last updated: 01/19/2008 Family History Hypertension Family History of CAD Female 1st degree relative at 48yo Family History of Stroke F 1st degree relative <60 MGM--DM PGM--DM MGF--DM  Social History: Last updated: 01/19/2008 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 Family History Hypertension Family History of CAD Female 1st degree relative at 48yo Family History of Stroke F 1st degree relative <60 MGM--DM PGM--DM MGF--DM  Social History: Reviewed history from 01/19/2008 and no changes required. Occupation: [employer] Divorced Never Smoked Alcohol use-yes Drug use-no Regular exercise-yes  Review of Systems      See HPI  Physical Exam  General:  Well-developed,well-nourished,in no acute distress; alert,appropriate and cooperative throughout examination Lungs:  Normal respiratory effort, chest expands symmetrically. Lungs are clear to auscultation, no  crackles or wheezes. Heart:  normal rate and no murmur.   Msk:  normal ROM, no joint tenderness, no joint swelling, no joint warmth, no redness over joints, no joint deformities, no joint instability, and no crepitation.   Extremities:  No clubbing, cyanosis, edema, or deformity noted with  normal full range of motion of all joints.   Neurologic:  alert & oriented X3, strength normal in all extremities, gait normal, and DTRs symmetrical and normal.   Psych:  Cognition and judgment appear intact. Alert and cooperative with normal attention span and concentration. No apparent delusions, illusions, hallucinations   Impression & Recommendations:  Problem # 1:  MORBID OBESITY (ICD-278.01) con't with diet and exercise  rto 1 month Ht: 63 (06/05/2010)   Wt: 249.4 (08/09/2010)   BMI: 44.66 (07/10/2010)  Problem # 2:  LOW BACK PAIN, ACUTE (ICD-724.2)  Her updated medication list for this problem includes:    Vicodin Es 7.5-750 Mg Tabs (Hydrocodone-acetaminophen) .Marland Kitchen... 1 by mouth q6h as needed    Flexeril 10 Mg Tabs (Cyclobenzaprine hcl) .Marland Kitchen... 1 by mouth three times a day as needed  Discussed use of moist heat or ice, modified activities, medications, and stretching/strengthening exercises. Back care instructions given. To be seen in 2 weeks if no improvement; sooner if worsening of symptoms.   Complete Medication List: 1)  Valtrex 500 Mg Tabs (Valacyclovir hcl) .... Take one tablet daily 2)  Vicodin Es 7.5-750 Mg Tabs (Hydrocodone-acetaminophen) .Marland Kitchen.. 1 by mouth q6h as needed 3)  Flexeril 10 Mg Tabs (Cyclobenzaprine hcl) .Marland Kitchen.. 1 by mouth three times a day as needed  Patient Instructions: 1)  Please schedule a follow-up appointment in 1 month.    Orders Added: 1)  Est. Patient Level III [81191]

## 2010-09-18 NOTE — Assessment & Plan Note (Signed)
Summary: FOR WEIGHT MANAGEMENT//PH   Vital Signs:  Patient profile:   50 year old female Weight:      249.4 pounds BMI:     44.34 Pulse rate:   76 / minute Pulse rhythm:   regular BP sitting:   132 / 76  (left arm) Cuff size:   large  Vitals Entered By: Almeta Monas CMA Duncan Dull) (September 09, 2010 9:21 AM) CC: weight check   History of Present Illness: Pt here for weight check. Pt will start preop diet in February.  Now she is still on low carb diet and is on nutritionist.  Pt is still exercising regularly.     Preventive Screening-Counseling & Management  Alcohol-Tobacco     Alcohol drinks/day: <1     Alcohol type: all     Alcohol Counseling: not indicated; use of alcohol is not excessive or problematic     Smoking Status: never     Passive Smoke Exposure: no  Caffeine-Diet-Exercise     Caffeine use/day: 0     Diet Comments: trying to eat healthy     Diet Counseling: works with trainer with diet     Does Patient Exercise: yes     Type of exercise: training      Exercise (avg: min/session): >60     Times/week: 4     Exercise Counseling: to improve exercise regimen  Hep-HIV-STD-Contraception     HIV Risk: no risk noted     HIV Risk Counseling: not indicated-no HIV risk noted     STD Risk: no risk noted     Dental Visit-last 6 months yes     Dental Care Counseling: not indicated; dental care within six months     SBE monthly: no     SBE Education/Counseling: to perform regular SBE  Safety-Violence-Falls     Seat Belt Use: yes     Firearms in the Home: no firearms in the home     Smoke Detectors: yes     Violence in the Home: no risk noted     Sexual Abuse: no      Sexual History:  currently monogamous.    Problems Prior to Update: 1)  Low Back Pain, Acute  (ICD-724.2) 2)  Gerd  (ICD-530.81) 3)  Vaginitis  (ICD-616.10) 4)  Elevated Bp Reading Without Dx Hypertension  (ICD-796.2) 5)  Morbid Obesity  (ICD-278.01) 6)  Rectal Bleeding  (ICD-569.3) 7)   Preventive Health Care  (ICD-V70.0) 8)  Family History of Cad Female 1st Degree Relative <50  (ICD-V17.3) 9)  Genital Herpes, Hx of  (ICD-V13.8) 10)  Boils, Recurrent  (ICD-680.9) 11)  Vaginitis Nos  (ICD-616.10) 12)  Fh of Hx, Family, Diabetes Mellitus  (ICD-V18.0) 13)  Hx, Family, Stroke  (ICD-V17.1)  Medications Prior to Update: 1)  Valtrex 500 Mg  Tabs (Valacyclovir Hcl) .... Take One Tablet Daily 2)  Vicodin Es 7.5-750 Mg Tabs (Hydrocodone-Acetaminophen) .Marland Kitchen.. 1 By Mouth Q6h As Needed 3)  Flexeril 10 Mg Tabs (Cyclobenzaprine Hcl) .Marland Kitchen.. 1 By Mouth Three Times A Day As Needed  Current Medications (verified): 1)  Valtrex 500 Mg  Tabs (Valacyclovir Hcl) .... Take One Tablet Daily 2)  Vicodin Es 7.5-750 Mg Tabs (Hydrocodone-Acetaminophen) .Marland Kitchen.. 1 By Mouth Q6h As Needed 3)  Flexeril 10 Mg Tabs (Cyclobenzaprine Hcl) .Marland Kitchen.. 1 By Mouth Three Times A Day As Needed  Allergies (verified): No Known Drug Allergies  Past History:  Family History: Last updated: 01/19/2008 Family History Hypertension Family History of CAD Female 1st  degree relative at 48yo Family History of Stroke F 1st degree relative <60 MGM--DM PGM--DM MGF--DM  Social History: Last updated: 01/19/2008 Occupation: [employer] Divorced Never Smoked Alcohol use-yes Drug use-no Regular exercise-yes  Risk Factors: Alcohol Use: <1 (09/09/2010) Caffeine Use: 0 (09/09/2010) Diet: trying to eat healthy (09/09/2010) Exercise: yes (09/09/2010)  Risk Factors: Smoking Status: never (09/09/2010) Passive Smoke Exposure: no (09/09/2010)  Past medical, surgical, family and social histories (including risk factors) reviewed for relevance to current acute and chronic problems.  Past Medical History: Reviewed history from 01/19/2008 and no changes required. 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 Hysterectomy 4/05 myomectomy 05/1999 Miscarriage 98 and 96  Family History: Reviewed history from 01/19/2008 and no changes required. Family History Hypertension Family History of CAD Female 1st degree relative at 48yo Family History of Stroke F 1st degree relative <60 MGM--DM PGM--DM MGF--DM  Social History: Reviewed history from 01/19/2008 and no changes required. Occupation: [employer] Divorced Never Smoked Alcohol use-yes Drug use-no Regular exercise-yes Sexual History:  currently monogamous  Review of Systems      See HPI  Physical Exam  General:  Well-developed,well-nourished,in no acute distress; alert,appropriate and cooperative throughout examination Neck:  No deformities, masses, or tenderness noted. Lungs:  Normal respiratory effort, chest expands symmetrically. Lungs are clear to auscultation, no crackles or wheezes. Heart:  normal rate and no murmur.   Psych:  Oriented X3 and normally interactive.     Impression & Recommendations:  Problem # 1:  MORBID OBESITY (ICD-278.01) con't diet and exercise Pt waiting for lab band surgery Ht: 63 (06/05/2010)   Wt: 249.4 (09/09/2010)   BMI: 44.34 (09/09/2010)  Problem # 2:  LOW BACK PAIN, ACUTE (ICD-724.2)  Her updated medication list for this problem includes:    Vicodin Es 7.5-750 Mg Tabs (Hydrocodone-acetaminophen) .Marland Kitchen... 1 by mouth q6h as needed    Flexeril 10 Mg Tabs (Cyclobenzaprine hcl) .Marland Kitchen... 1 by mouth three times a day as needed  Discussed use of moist heat or ice, modified activities, medications, and stretching/strengthening exercises. Back care instructions given. To be seen in 2 weeks if no improvement; sooner if worsening of symptoms.   Complete Medication List: 1)  Valtrex 500 Mg Tabs (Valacyclovir hcl) .... Take one tablet daily 2)  Vicodin Es 7.5-750 Mg Tabs (Hydrocodone-acetaminophen) .Marland Kitchen.. 1 by mouth  q6h as needed 3)  Flexeril 10 Mg Tabs (Cyclobenzaprine hcl) .Marland Kitchen.. 1 by mouth three times a day as needed   Orders Added: 1)  Est. Patient Level III [[address]109]

## 2010-09-18 NOTE — Progress Notes (Signed)
Summary: refill  Phone Note Refill Request Call back at [phone] Message from:  Patient  Refills Requested: Medication #1:  FLEXERIL 10 MG TABS 1 by mouth three times a day as needed. last filled 07-10-10 #30, [facility]09-10[facility] [address]  Initial call taken by: Jeremy Johann CMA,  September 10, 2010 9:47 AM  Follow-up for Phone Call        refill x1 Follow-up by: Loreen Freud DO,  September 10, 2010 10:31 AM    Prescriptions: FLEXERIL 10 MG TABS (CYCLOBENZAPRINE HCL) 1 by mouth three times a day as needed  #30 x 0   Entered by:   Almeta Monas CMA (AAMA)   Authorized by:   Loreen Freud DO   Signed by:   Almeta Monas CMA (AAMA) on 09/10/2010   Method used:   [facility] ...       [facility]  [address] [id number]       [address]       [place], [place]  [address]       Ph: [phone] or [phone]       Fax: [phone]   RxID:   [id number]

## 2010-10-10 ENCOUNTER — Encounter: Payer: Managed Care, Other (non HMO) | Attending: General Surgery

## 2010-10-10 DIAGNOSIS — Z713 Dietary counseling and surveillance: Secondary | ICD-10-CM | POA: Insufficient documentation

## 2010-10-10 DIAGNOSIS — Z01818 Encounter for other preprocedural examination: Secondary | ICD-10-CM | POA: Insufficient documentation

## 2010-10-22 ENCOUNTER — Other Ambulatory Visit: Payer: Self-pay | Admitting: Surgery

## 2010-10-22 ENCOUNTER — Encounter (HOSPITAL_COMMUNITY): Payer: Managed Care, Other (non HMO) | Attending: Surgery

## 2010-10-22 DIAGNOSIS — Z01812 Encounter for preprocedural laboratory examination: Secondary | ICD-10-CM | POA: Insufficient documentation

## 2010-10-22 DIAGNOSIS — Z01810 Encounter for preprocedural cardiovascular examination: Secondary | ICD-10-CM | POA: Insufficient documentation

## 2010-10-22 LAB — CBC
MCH: 30.6 pg (ref 26.0–34.0)
MCHC: 34 g/dL (ref 30.0–36.0)
MCV: 90 fL (ref 78.0–100.0)
Platelets: 380 10*3/uL (ref 150–400)
RBC: 4.41 MIL/uL (ref 3.87–5.11)
RDW: 13.1 % (ref 11.5–15.5)

## 2010-10-22 LAB — DIFFERENTIAL
Eosinophils Absolute: 0.1 10*3/uL (ref 0.0–0.7)
Eosinophils Relative: 1 % (ref 0–5)
Lymphs Abs: 2.2 10*3/uL (ref 0.7–4.0)
Monocytes Absolute: 0.7 10*3/uL (ref 0.1–1.0)
Monocytes Relative: 9 % (ref 3–12)

## 2010-10-22 LAB — COMPREHENSIVE METABOLIC PANEL
ALT: 28 U/L (ref 0–35)
AST: 23 U/L (ref 0–37)
Albumin: 4 g/dL (ref 3.5–5.2)
Calcium: 9.3 mg/dL (ref 8.4–10.5)
GFR calc Af Amer: >60 mL/min (ref >60)
Potassium: 4.2 mEq/L (ref 3.5–5.1)
Sodium: 136 mEq/L (ref 135–145)
Total Protein: 8.3 g/dL (ref 6.0–8.3)

## 2010-10-29 ENCOUNTER — Ambulatory Visit (HOSPITAL_COMMUNITY)
Admission: RE | Admit: 2010-10-29 | Discharge: 2010-10-30 | DRG: 621 | Disposition: A | Discharge status: Home / Self Care | Payer: Managed Care, Other (non HMO) | Source: Ambulatory Visit | Attending: Surgery | Admitting: Surgery

## 2010-10-29 DIAGNOSIS — Z90710 Acquired absence of both cervix and uterus: Secondary | ICD-10-CM | POA: Insufficient documentation

## 2010-10-29 DIAGNOSIS — N83209 Unspecified ovarian cyst, unspecified side: Secondary | ICD-10-CM | POA: Insufficient documentation

## 2010-10-29 DIAGNOSIS — R0609 Other forms of dyspnea: Secondary | ICD-10-CM | POA: Insufficient documentation

## 2010-10-29 DIAGNOSIS — Z6841 Body Mass Index (BMI) 40.0 and over, adult: Secondary | ICD-10-CM

## 2010-10-29 DIAGNOSIS — Z79899 Other long term (current) drug therapy: Secondary | ICD-10-CM | POA: Insufficient documentation

## 2010-10-29 DIAGNOSIS — R0989 Other specified symptoms and signs involving the circulatory and respiratory systems: Secondary | ICD-10-CM | POA: Insufficient documentation

## 2010-10-29 HISTORY — PX: LAPAROSCOPIC GASTRIC BANDING: SHX1100

## 2010-10-30 ENCOUNTER — Other Ambulatory Visit: Payer: Self-pay | Admitting: Gynecologic Oncology

## 2010-10-30 ENCOUNTER — Inpatient Hospital Stay (HOSPITAL_COMMUNITY): Payer: Managed Care, Other (non HMO)

## 2010-10-30 DIAGNOSIS — N83209 Unspecified ovarian cyst, unspecified side: Secondary | ICD-10-CM

## 2010-10-30 LAB — CBC
Hemoglobin: 11.4 g/dL — ABNORMAL LOW (ref 12.0–15.0)
MCH: 30 pg (ref 26.0–34.0)
MCV: 90.3 fL (ref 78.0–100.0)
Platelets: 323 10*3/uL (ref 150–400)
RBC: 3.8 MIL/uL — ABNORMAL LOW (ref 3.87–5.11)

## 2010-10-30 LAB — DIFFERENTIAL
Basophils Relative: 0 % (ref 0–1)
Eosinophils Relative: 0 % (ref 0–5)
Monocytes Absolute: 0.7 10*3/uL (ref 0.1–1.0)
Monocytes Relative: 7 % (ref 3–12)
Neutro Abs: 6.6 10*3/uL (ref 1.7–7.7)

## 2010-10-30 NOTE — Op Note (Signed)
  [name], Susan Lamb               ACCOUNT NO.:  [id number]  MEDICAL RECORD NO.:  [id number]           PATIENT TYPE:  I  LOCATION:  1534                         FACILITY:  [facility]  PHYSICIAN:  Thornton Park. Daphine Deutscher, MD  DATE OF BIRTH:  Nov 28, 1960  DATE OF PROCEDURE:  10/29/2010 DATE OF DISCHARGE:                              OPERATIVE REPORT   PREOPERATIVE DIAGNOSIS:  Morbid obesity, body mass index of 44.  PROCEDURE:  Laparoscopic adjustable gastric banding (Allergan APS system).  POSTOPERATIVE FINDINGS:  Left ovarian cystic mass with intraoperative consult with Telford Nab and Dr. Nelly Rout.  The patient's family made aware that postop visit will be arranged.  SURGEON:  Thornton Park. Daphine Deutscher, MD  ASSISTANT:  Lorne Skeens. Hoxworth, M.D.  ANESTHESIA:  General endotracheal.  DESCRIPTION OF PROCEDURE:  This 50 year old African American female was taken to room 1 at [facility] on October 29, 2010, and given general anesthesia.  The abdomen was prepped with a Techni-Care equivalent and draped sterilely.  Access to the abdomen was achieved with a 0-degree Optiview through the left upper quadrant.  The abdomen was insufflated.  Standard trocar placements were used including a 15 in the right upper quadrant.  Nathanson liver retractor was placed in the upper abdomen.  Left crus was scored with a Bovie and the band passer was inserted and blunt dissection was performed.  Went over on the right crus, identified the fat stripe, and then did a retrogastric dissection. The APS band was felt to be appropriately sized for her foregut fat and was inserted and passed through the band passer, brought around and buckled.  The band sizing tube was inserted and it was sized as I clipped it into place and engaged the buckle.  The tubing was removed and it was held down and then was plicated with 3 sutures of Surgidac placed freely.  In the course of doing this, I had noticed this dark area  down in the pelvis that looked like a little black speck.  As I uncovered it, it proved to be this large cystic mass in the left ovary. It was probably about 4 to 4.5 cm in diameter gauged with the Prestige grasper which was about 3.5 cm with the jaws open.  Picture was given to the family members regarding this.  Prior to completing, the port sites were all injected with Marcaine, was connected to the port which was placed in the subcutaneous pocket in the right lower incision.  Ports were all injected and closed with 4-0 Vicryl, benzoin, and Steri-Strips.  The patient seemed to tolerate the procedure well and was taken to recovery room in satisfactory condition.     Thornton Park Daphine Deutscher, MD     MBM/MEDQ  D:  10/29/2010  T:  10/30/2010  Job:  [id number]  cc:   Lelon Perla, DO [address] [place], [place] [address]  Telford Nab, R.N. [address] [place], [place] [address]  Electronically Signed by Luretha Murphy MD on 10/30/2010 01:51:34 PM

## 2010-11-12 ENCOUNTER — Encounter: Payer: Managed Care, Other (non HMO) | Attending: General Surgery

## 2010-11-12 DIAGNOSIS — Z713 Dietary counseling and surveillance: Secondary | ICD-10-CM | POA: Insufficient documentation

## 2010-11-12 DIAGNOSIS — Z01818 Encounter for other preprocedural examination: Secondary | ICD-10-CM | POA: Insufficient documentation

## 2010-11-14 ENCOUNTER — Ambulatory Visit: Payer: Managed Care, Other (non HMO) | Admitting: Gynecologic Oncology

## 2010-11-21 ENCOUNTER — Other Ambulatory Visit (HOSPITAL_COMMUNITY): Payer: Managed Care, Other (non HMO)

## 2010-11-21 ENCOUNTER — Ambulatory Visit: Payer: Managed Care, Other (non HMO) | Admitting: Gynecologic Oncology

## 2010-11-25 NOTE — Discharge Summary (Signed)
  Susan Lamb, Susan Lamb               ACCOUNT NO.:  [id number]  MEDICAL RECORD NO.:  [id number]           PATIENT TYPE:  I  LOCATION:  1534                         FACILITY:  [facility]  PHYSICIAN:  Thornton Park. Daphine Deutscher, MD  DATE OF BIRTH:  08/07/1961  DATE OF ADMISSION:  10/29/2010 DATE OF DISCHARGE:  10/30/2010                              DISCHARGE SUMMARY   ADMITTING DIAGNOSES: 1. Morbid obesity, status post laparoscopic adjustable gastric banding     (Allergan APS System). 2. Left ovarian cystic mass, for followup by Ms. Aundria Rud.  HOSPITAL COURSE:  Susan Lamb a 50 year old African American female who was taken the operating room on October 29, 2010.  She underwent the above-mentioned operation without complication.  Noted was the cyst and Ms. Aundria Rud showed a picture of this and discussed this with Dr. Nelly Rout.  Followup will be made subsequently.  After the band was placed, she was taken upstairs where she remained and did well.  X-rays on postop day #1 showed the band to be in good position.  She was ready for discharge at that time.  She was given Roxicet elixir to take for pain and will be followed up in the office in 2 weeks.     Thornton Park Daphine Deutscher, MD     MBM/MEDQ  D:  10/30/2010  T:  10/30/2010  Job:  [id number]  cc:   Telford Nab, R.N. [address] [place], [place] [address]  Electronically Signed by Luretha Murphy MD on 11/25/2010 09:26:10 AM

## 2010-12-03 ENCOUNTER — Other Ambulatory Visit (HOSPITAL_COMMUNITY): Payer: Self-pay | Admitting: Obstetrics and Gynecology

## 2010-12-03 DIAGNOSIS — Z1231 Encounter for screening mammogram for malignant neoplasm of breast: Secondary | ICD-10-CM

## 2010-12-24 ENCOUNTER — Encounter: Payer: Managed Care, Other (non HMO) | Attending: General Surgery | Admitting: *Deleted

## 2010-12-24 DIAGNOSIS — Z713 Dietary counseling and surveillance: Secondary | ICD-10-CM | POA: Insufficient documentation

## 2010-12-24 DIAGNOSIS — Z01818 Encounter for other preprocedural examination: Secondary | ICD-10-CM | POA: Insufficient documentation

## 2010-12-24 NOTE — Letter (Signed)
June 10, 2008    Thornton Park. Daphine Deutscher, MD  [address]  [place], [place] [address]   RE:  Susan Lamb, Susan Lamb  MRN:  [id number]  /  DOB:  [birth year]   Dear Dr. Daphine Deutscher:   Susan Lamb is a 50 year old female, patient of mine, who has a history of  elevated blood pressure, morbid obesity, and genital herpes.  She was  struggled with weight for most of her life and has tried many things to  try lose weight and most recent attempt was Adipex 37.5 mg daily with  diet and exercise.  She has done research on gastric bypass surgery and  lap band surgery, and was decided to like to pursue lap band surgery.  She understands the risks involved and the dietary changes that will be  necessary in order to make the surgery successful.  Five years of  history should be being forwarded to your office as well.  I believe  that Susan Lamb would be an excellent candidate for lap band surgery.  Please feel free to call with any further questions or information  needed.    Sincerely,      Lelon Perla, DO  Electronically Signed    Shawnie Dapper  DD: 06/10/2008  DT: 06/11/2008  Job #: [id number]

## 2010-12-27 NOTE — H&P (Signed)
NAME:  Susan Lamb, Susan Lamb                         ACCOUNT NO.:  [id number]   MEDICAL RECORD NO.:  [id number]                   PATIENT TYPE:  AMB   LOCATION:  SDC                                  FACILITY:  [facility]   PHYSICIAN:  Hal Morales, M.D.             DATE OF BIRTH:  February 22, 1961   DATE OF ADMISSION:  11/10/2002  DATE OF DISCHARGE:                                HISTORY & PHYSICAL   HISTORY OF PRESENT ILLNESS:  This is a 50 year old single, African-American  female, para 1-0-3-1, with a history of uterine fibroids, who presents for  hysteroscopy with D&C for removal of an endometrial polyp.  For over one  year, the patient complained of intermenstrual bleeding and menorrhagia  which has worsened as time progressed.  The patient who currently takes oral  contraceptives, notes that one week prior to her placebo pills, she will  experience a two to three-day flow of blood with clots which causes her to  change her pads approximately 10 times a day. The week of her placebo pills,  the patient will have a normal menstrual flow which lasts for six days in  which she changes her pads six times per day.  The patient reports minimal  cramping which is relieved by ibuprofen 600 mg. The patient had a TSH in  October of 2003 which was within normal limits.  She also had a pelvic  ultrasound and sonohysterogram at the same time which revealed a fibroid  uterus, the largest of which measured 3.6 x 3.4 x 4.5 cm along with a 1.3 cm  endometrial polyp in the left fundal region of the endometrial cavity.  After considering her options which include hysteroscopy with polypectomy or  hysterectomy, the patient has opted to undergo the former and has given  consent for the same.   PAST MEDICAL HISTORY:  Gravida 4, para 1-0-3-1; the patient has a history of  habitual abortions; she has a positive anticardiolipin antibody.  GYN:  Menarche at 50 years old. Menstrual periods; see history of present  illness.  The patient uses Ortho-Tri-Cyclen as her method of contraception. She has a  remote history of Chlamydia. Denies any history of abnormal Pap smears. Her  last normal Pap smear was February of 2004, last normal mammogram was  November of 2003.  Positive for hydradenitis.  She does have a history of  anemia.   PAST SURGICAL HISTORY:  The patient had a myomectomy in 2000, a D&C in 1997  and in 1999.  Denies any history of blood transfusions or problems with  anesthesia.   FAMILY HISTORY:  Positive for heart disease, stroke, hypertension, and  diabetes.   SOCIAL HISTORY:  The patient is single and she works in Clinical biochemist.   MEDICATIONS:  Ortho-Tri-Cyclen.   ALLERGIES:  No known drug allergies.   REVIEW OF SYSTEMS:  The patient does wear glasses. She does experience  occasional  lower back pain. Otherwise review of systems is negative.   HABITS:  The patient does not smoke and she drinks alcohol socially.   PHYSICAL EXAMINATION:  VITAL SIGNS: Blood pressure 120/70, weight 238,  height 5 feet 2-1/2 inches tall.  HEENT:  Within normal limits.  NECK:  Supple without masses. There is no thyromegaly or cervical  adenopathy.  HEART:  Regular rate and rhythm.  No murmur.  LUNGS:  Clear to auscultation.  There are no wheezes, rales, or rhonchi.  BACK:  Without CVA tenderness.  ABDOMEN:  Bowel sounds are present. It is soft without tenderness.  The  patient does have a firm mass arising from the pelvis approximately three  fingerbreadths above the symphysis pubis. She has no organomegaly.  EXTREMITIES:. Without cyanosis, clubbing, or edema.  PELVIC:  EGBUS is within normal limits. Vagina is rugous. The patient does  have blood in the vagina. Cervix is nontender without lesions. Uterus is 12  to 16 weeks size, firm without tenderness. Adnexa without tenderness or  masses.  RECTOVAGINAL: Without tenderness or masses.   IMPRESSION:  1. Menorrhagia.  2. Intermenstrual  bleeding.  3. Endometrial polyp.  4. Fibroid uterus.   DISPOSITION:  A discussion was held with the patient regarding the  implications for her procedure along with its risks which include reaction  to anesthesia, damage to adjacent organs, bleeding, and infection. The  patient has consented to undergo a hysteroscopy with D&C and removal of  endometrial polyp at [facility] on November 17, 2002, at 9 a.m.     Elmira J. Adline Peals.                    Hal Morales, M.D.    EJP/MEDQ  D:  11/10/2002  T:  11/10/2002  Job:  [id number]

## 2010-12-27 NOTE — Discharge Summary (Signed)
NAME:  Susan Lamb, Susan Lamb                         ACCOUNT NO.:  [id number]   MEDICAL RECORD NO.:  [id number]                   PATIENT TYPE:  INP   LOCATION:  9306                                 FACILITY:  [facility]   PHYSICIAN:  Hal Morales, M.D.             DATE OF BIRTH:  [birth year]   DATE OF ADMISSION:  11/21/2003  DATE OF DISCHARGE:  11/23/2003                                 DISCHARGE SUMMARY   DISCHARGE DIAGNOSES:  1. Menorrhagia.  2. Fibroids.  3. Right ovarian cyst.  4. Extensive pelvis adhesions.   OPERATIONS:  On the date of admission, the patient underwent an exploratory  laparotomy with a total abdominal hysterectomy and a right ovarian  cystectomy with lysis of adhesions, tolerating all procedures well.  The  patient was found to have a uterus enlarged to approximately 14 weeks size,  weighing 396 gm, along with 2 simple right ovarian cysts, and a left ovary  which was adherent to her bowel serosa.   HISTORY OF PRESENT ILLNESS:  Susan Lamb is a 50 year old divorced African-  American female, para 1-0-3-1, with a long-standing history of uterine  fibroids (status post myomectomy), who presents for a total abdominal  hysterectomy with right ovarian cystectomy because of prolonged menstrual  bleeding dysmenorrhea, recurrent bacterial vaginosis, and right ovarian  cyst.  Please see patient's dictated history and physical examination for  details.   PREOPERATIVE PHYSICAL EXAMINATION:  VITAL SIGNS:  Blood pressure 120/80,  weight 244 pounds, height 5', 3.5 tall.  GENERAL:  Within normal limits.  ABDOMEN:  Bowel sounds were present.  It is soft without tenderness,  guarding, rebound, or organomegaly, though her exam was somewhat compromised  by her body habitus.  PELVIC:  EGBUS within normal limits.  Vagina is rugose.  Cervix is nontender  without lesions.  Uterus appears 14-16 weeks size, irregular, and without  tenderness.  Adnexa without tenderness or masses.  RECTOVAGINAL:  Without masses, though patient does have a small posterior  fibroid.   HOSPITAL COURSE:  On the date of admission, the patient underwent the  aforementioned procedures, tolerating them all well.  Postoperative course  was unremarkable, with the patient having a postoperative hemoglobin of 10.1  (preoperative hemoglobin 11.8).  By postoperative day #2, the patient had  resumed bowel and bladder function, and was therefore deemed ready for  discharge home.   DISCHARGE MEDICATIONS:  1. Iron 325 mg one tablet twice daily for 6 weeks.  2. Phenergan 25 mg one tablet q.6h. as needed for nausea.  3. Colace 100 mg one tablet twice daily until bowel movements are regular.  4. Ibuprofen 600 mg one tablet with food q.6h. for 5 days, then as needed     for pain.  5. Percocet 5/325, 1-2 tablets q.4h. as needed for pain.   FOLLOW UP:  The patient is scheduled for a staple removal at [facility] on November 28, 2003 at 2:30 p.m.  She has a 6 weeks  postoperative visit with Dr. Pennie Rushing on Jan 02, 2004 at 3:00 p.m.   DISCHARGE INSTRUCTIONS:  The patient was given a copy of the [address]  [facility] postoperative instruction sheet.  She was thoroughly advised  to avoid driving for 2 weeks, heavy lifting for 4 weeks, and intercourse for  6 weeks.   DIET:  Without restriction.   FINAL PATHOLOGY:  1. Uterus excision:  Benign attenuated endometrium without hyperplasia or     evidence of malignancy.  Numerous benign leiomyomas.  Serosal     fibrovascular adhesions.  No endometriosis identified.  Mild erosive     cervicitis with nabothian cyst.  No dysplasia identified.  2. Ovarian cyst right biopsies:  Fragments of benign ovarian parenchyma,     with features suggestive of a portion of ovarian follicular cyst wall.     Elmira J. Adline Peals.                    Hal Morales, M.D.    EJP/MEDQ  D:  12/07/2003  T:  12/07/2003  Job:  [id number]

## 2010-12-27 NOTE — Op Note (Signed)
NAME:  Susan Lamb, Susan Lamb                         ACCOUNT NO.:  [id number]   MEDICAL RECORD NO.:  [id number]                   PATIENT TYPE:  INP   LOCATION:  9306                                 FACILITY:  [facility]   PHYSICIAN:  Hal Morales, M.D.             DATE OF BIRTH:  08-19-1960   DATE OF PROCEDURE:  11/21/2003  DATE OF DISCHARGE:                                 OPERATIVE REPORT   PREOPERATIVE DIAGNOSES:  1. Menorrhagia.  2. Uterine fibroids.  3. Right ovarian cyst.   POSTOPERATIVE DIAGNOSES:  1. Menorrhagia.  2. Uterine fibroids.  3. Right ovarian cyst.  4. Extensive pelvic adhesions.   OPERATION:  Exploratory laparotomy, total abdominal hysterectomy, lysis of  adhesions, right ovarian cystectomy.   ANESTHESIA:  General orotracheal.   SURGEON:  Hal Morales, M.D.   FIRST ASSISTANT:  1. Osborn Coho, M.D.  2. Janine Limbo, M.D.   FINDINGS:  The uterus was enlarged to approximately 14-week size with  multiple myomata.  It weighed 396 g.  The right ovary contained two simple  cysts with clear fluid.  The left ovary was adherent to the left pelvic  sidewall and the bowel as well as to the posterior uterus.   SPECIMENS TO PATHOLOGY:  Uterus, cervix and right ovarian cyst wall.   PROCEDURE:  The patient was taken to the operating room after appropriate  identification and placed on the operating table.  After the attainment of  adequate general anesthesia, she was placed in the supine position and the  abdomen, perineum and vagina were prepped with multiple layers of Betadine.  A Foley catheter was inserted into the bladder and connected to straight  drainage.  The abdomen was draped as a sterile field.  The suprapubic region  was infiltrated with 15 mL of 0.25% Marcaine and a suprapubic incision made.  The abdomen was opened in layers.  Peritoneum was entered and pelvic  washings initially obtained which were later discarded.  The self-retaining  O'Connor-O'Sullivan retractor was placed and the bowel packed cephalad.  Lysis of adhesions allowed the uterus to be grasped at each cornual region  with a Kelly clamp and elevated into the operative field.  Further lysis of  adhesions between the posterior uterus and the left ovary allowed isolation  of the utero-ovarian ligament and this was clamped, cut, and suture ligated.  The right round ligament was then identified, clamped, cut, and suture  ligated and that incision then incised.  That incision was taken anteriorly  on the anterior leaf on the broad ligament.  The utero-ovarian ligament on  the right side was then clamped, cut and tied with a free tie and suture  ligated.  A similar procedure was carried out with a round ligament on the  left side and then the upper pedicle including the left fallopian tube to  free those areas.  The bladder was dissected off the anterior  cervix and the  uterine arteries on the right and left side skeletonized then clamped, cut,  and suture ligated.  The paracervical tissues were then clamped, cut, and  suture ligated down to the level of the uterosacral ligaments which were  clamped, cut, and suture ligated and those sutures held.  The vaginal angles  were then clamped, cut, and suture ligated and the uterus and cervix excised  from the upper vagina and removed from the operative field.  The vaginal  cuff was closed with figure-of-eight sutures.  All sutures at this point  were 0 Vicryl.  Hemostasis was achieved in the bladder flap with Bovie  cautery and the right ovary was incised to expose and underlying simple cyst  from which egressed clear fluid. That cyst wall was dissected out and the  cut ends of the ovary cauterized for adequate hemostasis.  The vaginal angle  sutures and uterosacral ligament sutures were then tied together on either  side and copious irrigation carried out.  Hemostasis was noted to be  adequate and all instruments and  sponges were removed from the peritoneal  cavity.  The abdominal peritoneum was then closed with running suture of 2-0  Vicryl.  The rectus muscles were copious irrigation and made hemostatic with  Bovie cautery.  The rectus fascia was closed with running sutures of 0  Vicryl from each apex to the midline.  The subcutaneous tissue was copious  irrigation and made hemostatic with Bovie cautery.  A Jackson-Pratt drain  was then placed in the subcutaneous tissue and exited through a stab wound  in the right lower quadrant.  The drain was sewn in with a suture of 2-0  silk.  Staples were used to close the skin incision.  A sterile dressing was  applied.  The patient was awakened from general anesthesia and taken to the  recovery room in satisfactory condition, having tolerated the procedure well  with sponge and instrument counts correct.  The estimated blood loss was 175  mL.                                               Hal Morales, M.D.    VPH/MEDQ  D:  11/21/2003  T:  11/22/2003  Job:  [id number]

## 2010-12-27 NOTE — Op Note (Signed)
NAME:  Susan Lamb, HALK                         ACCOUNT NO.:  [id number]   MEDICAL RECORD NO.:  [id number]                   PATIENT TYPE:  AMB   LOCATION:  SDC                                  FACILITY:  [facility]   PHYSICIAN:  Hal Morales, M.D.             DATE OF BIRTH:  25-Mar-1961   DATE OF PROCEDURE:  11/17/2002  DATE OF DISCHARGE:                                 OPERATIVE REPORT   PREOPERATIVE DIAGNOSES:  1. Intermenstrual bleeding.  2. Menorrhagia.  3. Uterine fibroids.  4. Endometrial polyp noted on ultrasound.   POSTOPERATIVE DIAGNOSES:  1. Intermenstrual bleeding.  2. Menorrhagia.  3. Uterine fibroids.  4. Endometrial polyp noted on ultrasound.   OPERATION/PROCEDURE:  Hysteroscopy and dilatation and curettage.   ANESTHESIA:  General orotracheal.   SURGEON:  Hal Morales, M.D.   ESTIMATED BLOOD LOSS:  Less than 100 cc.   COMPLICATIONS:  None.   FINDINGS:  The uterus was approximately 10-12 weeks size on bimanual  examination.  The uterine cavity sounded to 11 cm.  There was a small  endometrial polyp noted initially at hysteroscopy and then a 4 cm submucosal  intracavitary fibroid.   DESCRIPTION OF PROCEDURE:  The patient was taken to the operating room after  appropriate identification and placed on the operating table.  After the  attainment of adequate general anesthesia, she was placed in the lithotomy  position.  The perineum and vagina were prepped with multiple layers of  Betadine and a red Robinson catheter used to empty the bladder.  The  perineum was draped as a sterile field.  A Graves speculum was placed in the  vagina and a single-tooth tenaculum placed on the anterior cervix.  The  diagnostic hysteroscope was used to document the above noted findings.  The  operative hysteroscope was then placed.  Between these two D&C and removal  of the apparent polyp were taken and the 4 cm fibroid cyst better  visualized.  The fluid deficit at this time  was approximately 300 cc and the  decision was made not to proceed with any further hysteroscopic removal.  All instruments were then removed from the vagina and a suture of 0 chromic  was used to achieve hemostasis on the anterior lip of the cervix after  removal of the single-tooth tenaculum.  Specimens to pathology:  Endometrial  polyp,  endometrial curetting.  Once all instruments and specimens had been removed  from the vagina, the patient was awakened from general anesthesia and taken  to the recovery room in satisfactory condition having tolerated the  procedure well with sponge and instrument counts correct.                                                Erie Noe P  Pennie Rushing, M.D.    VPH/MEDQ  D:  11/17/2002  T:  11/18/2002  Job:  [id number]

## 2010-12-27 NOTE — H&P (Signed)
NAME:  Susan Lamb, Susan Lamb                         ACCOUNT NO.:  [id number]   MEDICAL RECORD NO.:  [id number]                   PATIENT TYPE:  AMB   LOCATION:  [facility]                                  FACILITY:  [facility]   PHYSICIAN:  Hal Morales, M.D.             DATE OF BIRTH:  March 23, 1961   DATE OF ADMISSION:  11/21/2003  DATE OF DISCHARGE:                                HISTORY & PHYSICAL   HISTORY OF PRESENT ILLNESS:  Ms. Newsome is a 50 year old divorced African-  American female para 1-0-3-1 with a longstanding history of uterine  fibroids, status post myomectomy, who presents for a total abdominal  hysterectomy with a right ovarian cystectomy because of prolonged menstrual  bleeding, dysmenorrhea, recurrent bacterial vaginosis and a right ovarian  cyst.  For at least 2 years patient has experienced a 10-day menstrual flow  with clotting in which she changes overnight pads every 1 to 1-1/2 hours.  This occurs in addition to random intermenstrual spotting throughout the  month leaving her with only a 7-day period of no vaginal bleeding.  As a  result patient endures cramping which she rates as a 10/10 on a 10 point  scale (reduced only to 7/10 with nonsteroidal anti-inflammatory medications)  and persistent bacterial vaginosis.  She denies any urinary tract symptoms,  fever, nausea, vomiting, diarrhea, or constipation.  A TSH a year ago was  within normal limits and an ultrasound with a sonohistogram (October 2003)  showed four discrete intramural fibroids ranging from 3.6 x 3.4 x 4.5 cm -  1.4 x 1.1 cm and a 1.3 cm endometrial polyp in the left fundal region of the  endometrial cavity.  Both ovaries appeared normal on this study.  Six months  later with continued intermenstrual bleeding patient proceeded with  hysteroscopic resection of the endometrial polyp.  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  Patient was then given the option to shrink the  myoma with Lupron followed by an attempt at hysteroscopic resection,  hysterectomy or observation.  Patient chose to observe.  Now, due to  relentless menorrhagia in spite of birth control pills and after considering  uterine artery embolization and finding that she was not an optimal  candidate due to intracavitary myomas patient has chosen total abdominal  hysterectomy for definitive therapy.  A pelvic MRI on October 10, 2003 revealed  a maximum uterine dimension of 14.5 x 8.8 x 8.7 cm with 10 identifiable  fibroids, the largest measuring 7.1 x 5.2 x 5.9 cm and three pedunculated  submucosal fibroids were seen in the endometrial cavity.  This MRI also  showed a 4.6 cm septated cyst eminating from the right adnexa.   PAST MEDICAL HISTORY/OBSTETRICAL HISTORY:  Gravida 4, para 1-0-3-1.  Patient  has a positive  anticardiolipin antibody.   GYNECOLOGIC HISTORY:  Menarche 50 years old.  Menstrual flow - please see  history of present illness.  Patient uses birth control pills as her method  of contraception.  She has a remote history of Chlamydia, herpes simplex  virus II.  She denies any history of abnormal Pap smears.  Her last  mammogram and Pap smear both of which were normal were in February 2005.   MEDICAL HISTOR:  Positive for anemia, migraines, eczema, hidradenitis  suppurativa, right breast cyst, hypercholesterolemia and hemorrhoids.   SURGICAL HISTORY:  In 19 D&C.  In 1999 D&C.  In 1999 excision of right  breast cyst (benign).  In 2000 myomectomy.  In 2004 hysteroscopic resection  of endometrial polyp.  Denies any history of blood transfusions or problems  with anesthesia.   FAMILY HISTORY:  Positive for heart disease, hypertension, diabetes, and  stroke.   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   HABITS:  Patient does not use  tobacco and she rarely consumes alcohol.   CURRENT MEDICATIONS:  1. Ortho Tri-Cyclen one tablet daily.  2. Metronidazole 500 mg one tablet weekly.  3. Calcium 1000 mg daily.  4. One-A-Day Women's vitamin one tablet daily.  5. Iron one tablet daily.   ALLERGIES:  Patient has no known drug allergies.   REVIEW OF SYSTEMS:  Negative except as mentioned in history of present  illness.   PHYSICAL EXAMINATION:  VITAL SIGNS:  Blood pressure 120/80.  Weight is 244  pounds.  Height is 5 feet 3-1/2 inches tall.  NECK:  Supple without masses.  There is no adenopathy or thyromegaly.  HEART:  Regular rate and rhythm.  There is no murmur.  LUNGS:  Clear to auscultation.  There are no wheezes, rales, or rhonchi.  BACK:  No CVA tenderness.  ABDOMEN:  Bowel sounds are present.  It is soft without tenderness,  guarding, rebound, or organomegaly though exam is somewhat compromised by  body habitus.  EXTREMITIES:  Without cyanosis, clubbing, or edema.  PELVIC:  EGBUS is within normal limits.  Vagina is rugous.  Cervix is  nontender without lesions.  Uterus appears 14 to 16 weeks size, irregular,  and without tenderness.  Adnexa without tenderness or masses.  Rectovaginal  without masses though patient does have a small fibroid.   IMPRESSION:  1. Symptomatic uterine fibroids.  2. Menorrhagia.  3. Persistent bacterial vaginosis.  4. Right ovarian cyst.   DISPOSITION:  A discussion was held with patient regarding medical and  surgical options for management of her presenting symptoms and she has  chosen definitive therapy with hysterectomy.  Patient understands the  implications for her procedure along with its risks which include but are  not limited to reaction to anesthesia, damage to adjacent organs, infection,  and excessive bleeding.  Patient further understands that with the uterus being removed that she will be rendered permanently incapable of bearing  children.  Patient has consented to  undergo a total abdominal hysterectomy  with a right ovarian cystectomy with the possibility of right salpingo-  oophorectomy at [facility] November 21, 2003 at 11 a.m.     [place] J. Adline Peals.                    Hal Morales, M.D.    EJP/MEDQ  D:  11/12/2003  T:  11/12/2003  Job:  [id number]

## 2010-12-27 NOTE — Op Note (Signed)
[facility]  Patient:    Susan Lamb, Susan Lamb                        MRN: [id number] Proc. Date: 08/18/00 Adm. Date:  08/18/00 Attending:  Abigail Miyamoto, M.D.                           Operative Report  PREOPERATIVE DIAGNOSIS:  Sebaceous cyst on posterior scalp.  POSTOPERATIVE DIAGNOSIS:  Sebaceous cyst on posterior scalp.  PROCEDURE:  Excision of sebaceous cyst on posterior scalp.  SURGEON:  Abigail Miyamoto, M.D.  ANESTHESIA:  1% lidocaine with epinephrine and monitored anesthesia care.  PROCEDURE IN DETAIL:  The patient was brought to the operating room and identified as Susan Lamb.  She was placed on prone on the operating room table and then anesthesia was induced.  Her head was then prepped and draped in the usual sterile fashion.  The skin overlying the large palpable cyst was then anesthetized with 1% lidocaine with epinephrine.  A transverse incision was then made across the mass.  Incision was carried down to the cyst with the cautery.  Metzenbaum scissors were then used to completely excise the sebaceous cyst and its capsule in its entirety.  The wound was then copiously irrigated.  Hemostasis was achieved with electrocautery.  Subcutaneous layer was then closed with interrupted 3-0 Vicryl sutures and the skin was closed with running 4-0 Vicryl suture.  Neosporin was then applied to the incision.  The patient tolerated the procedure well.  All sponge, needle and instrument counts were correct at the end of the procedure. The patient was taken in stable condition from the operating room to the recovery room. DD:  08/18/00 TD:  08/18/00 Job: [id number]

## 2011-01-02 ENCOUNTER — Ambulatory Visit (HOSPITAL_COMMUNITY)
Admission: RE | Admit: 2011-01-02 | Discharge: 2011-01-02 | Disposition: A | Discharge status: Home / Self Care | Payer: Managed Care, Other (non HMO) | Source: Ambulatory Visit | Attending: Obstetrics and Gynecology | Admitting: Obstetrics and Gynecology

## 2011-01-02 DIAGNOSIS — Z1231 Encounter for screening mammogram for malignant neoplasm of breast: Secondary | ICD-10-CM | POA: Insufficient documentation

## 2011-02-04 ENCOUNTER — Ambulatory Visit: Payer: Managed Care, Other (non HMO) | Admitting: *Deleted

## 2011-02-07 ENCOUNTER — Encounter (INDEPENDENT_AMBULATORY_CARE_PROVIDER_SITE_OTHER): Payer: Self-pay

## 2011-02-07 ENCOUNTER — Ambulatory Visit (INDEPENDENT_AMBULATORY_CARE_PROVIDER_SITE_OTHER): Payer: Managed Care, Other (non HMO) | Admitting: Physician Assistant

## 2011-02-07 VITALS — BP 118/80 | Ht 63.0 in | Wt 229.4 lb

## 2011-02-07 DIAGNOSIS — R635 Abnormal weight gain: Secondary | ICD-10-CM

## 2011-02-07 DIAGNOSIS — R5383 Other fatigue: Secondary | ICD-10-CM | POA: Insufficient documentation

## 2011-02-07 DIAGNOSIS — Z4651 Encounter for fitting and adjustment of gastric lap band: Secondary | ICD-10-CM

## 2011-02-07 DIAGNOSIS — R5381 Other malaise: Secondary | ICD-10-CM

## 2011-02-07 NOTE — Patient Instructions (Signed)
Take clear liquids for the next 48 hours. Thin protein shakes are ok to start on Saturday evening. Call us if you have persistent vomiting or regurgitation, night cough or reflux symptoms. Return as scheduled or sooner if you notice no changes in hunger/portion sizes.   

## 2011-02-07 NOTE — Progress Notes (Signed)
Subjective:     Patient ID: Susan Lamb, female   DOB: 1961-02-10, 50 y.o.   MRN: [id number]    BP 118/80  Ht 5\' 3"  (1.6 m)  Wt 229 lb 6.4 oz (104.055 kg)  BMI 40.64 kg/m2    HPI See scanned dictated note.  Review of Systems     Objective:   Physical Exam     Assessment:         Plan:

## 2011-02-19 ENCOUNTER — Encounter: Payer: Self-pay | Admitting: *Deleted

## 2011-02-19 ENCOUNTER — Encounter: Payer: Managed Care, Other (non HMO) | Attending: General Surgery | Admitting: *Deleted

## 2011-02-19 DIAGNOSIS — Z713 Dietary counseling and surveillance: Secondary | ICD-10-CM | POA: Insufficient documentation

## 2011-02-19 DIAGNOSIS — Z01818 Encounter for other preprocedural examination: Secondary | ICD-10-CM | POA: Insufficient documentation

## 2011-02-19 NOTE — Patient Instructions (Addendum)
   Continue on Phase 3B: High Protein + Non-Starchy Vegetables  Continue regular physical activity levels  Add fruit to diet in 1/2 cup (or 15 gram carb) servings  Follow up in 3 months at [facility]

## 2011-02-19 NOTE — Progress Notes (Signed)
  Follow-up visit: 3 months Post-Operative LAGB Surgery  Medical Nutrition Therapy:  Appt start time: 1700 end time:  1730.  Assessment:  Primary concerns today: post-operative bariatric surgery nutrition management. Susan Lamb notes that she is doing well with eating and exercising. She "got off track" with her diet around the 4th of July holiday but feels she is doing better this week. Pt to have a LAGB fill next week but feels she may need to cancel this because her band seems to be "in a good place".  Weight today: 225.2 lbs Weight change: 6.2 lbs down since last visit Total weight lost: 23.3 lbs total BMI: 40%  Typical Day Recall:  B (7:30AM): Protein (EAS) shake Snk (9  AM): cheese stick or low-cal yogurt   L (12 PM): Hot Dog frank OR Grilled chicken with 1 cup vegetable Snk (3 PM): cheese stick or yogurt  D (7 PM): Spinach salad with crab meat (3 oz) OR  Snk (8-9 PM): Occasionally eats a small portion of dinner meal  Fluid intake: water (48 oz), protein shake (11oz)  Estimated total protein intake: 60-75g  Medications: None reported Supplementation: Taking multivitamin (daily), liquid calcium citrate (1500mg )  Using straws: None Drinking while eating: None Hair loss: None reported Carbonated beverages: None N/V/D/C: Pt notes that food gets "stuck" and she has to "get rid of it" everyday (secondary to not chewing well) Dumping syndrome: N/A Last Lap-Band fill: Last fill 6/29 (Has had 2 fills total)  Recent physical activity:  Goes to the gym (cardio/weight training), 4 times/week, 60 minutes  Progress Towards Goal(s):  In progress.   Nutritional Diagnosis:  Susan Lamb-3.3 Overweight/obesity As related to past history of poor dietary habits and recent LAGB surgery.  As evidenced by pt with BMI of >30%.    Intervention:    Continue on Phase 3B: High Protein + Non-Starchy Vegetables  Continue regular physical activity levels  Add fruit to diet in 1/2 cup (or 15 gram carb)  servings  Follow up in 3 months at [facility]  Monitoring/Evaluation:  Dietary intake, exercise, lap band fills, and body weight. Follow up in 3 months for 6 month post-op visit.

## 2011-03-07 ENCOUNTER — Encounter (INDEPENDENT_AMBULATORY_CARE_PROVIDER_SITE_OTHER): Payer: Managed Care, Other (non HMO)

## 2011-04-03 ENCOUNTER — Encounter (INDEPENDENT_AMBULATORY_CARE_PROVIDER_SITE_OTHER): Payer: Self-pay | Admitting: Surgery

## 2011-04-04 ENCOUNTER — Ambulatory Visit (INDEPENDENT_AMBULATORY_CARE_PROVIDER_SITE_OTHER): Payer: Managed Care, Other (non HMO) | Admitting: Physician Assistant

## 2011-04-04 ENCOUNTER — Encounter (INDEPENDENT_AMBULATORY_CARE_PROVIDER_SITE_OTHER): Payer: Self-pay | Admitting: Physician Assistant

## 2011-04-04 NOTE — Progress Notes (Signed)
  HISTORY: Susan Lamb is a 50 y.o.female who received an AP-Standard lap-band in March 2012 by Dr. Daphine Deutscher. She's doing well with being able to tolerate solids without difficulty. Apparently she had problems earlier with almost daily regurgitation but she realized that she was eating far too quickly and without chewing adequately. Her portion sizes remain small and her hunger is under very good control. She believes that her schedule at work is interfering with her ability to take adequate protein and this will be her project for the next month.  VITAL SIGNS: Filed Vitals:   04/04/11 0852  BP: 116/78    PHYSICAL EXAM: Physical exam reveals a very well-appearing 50 y.o.female in no apparent distress Neurologic: Awake, alert, oriented Psych: Bright affect, conversant Respiratory: Breathing even and unlabored. No stridor or wheezing Extremities: Atraumatic, good range of motion. Skin: Warm, Dry, no rashes Musculoskeletal: Normal gait, Joints normal  ASSESMENT: 50 y.o.  female  s/p AP-Standard lap-band.   PLAN: We deferred an adjustment today as she is in the green zone. We'll have her back in 4-6 weeks for a recheck.

## 2011-04-04 NOTE — Patient Instructions (Signed)
Return in 4-6 weeks or sooner if necessary, especially if you notice increasing hunger or increasing portion sizes.

## 2011-05-09 ENCOUNTER — Ambulatory Visit (INDEPENDENT_AMBULATORY_CARE_PROVIDER_SITE_OTHER): Payer: Managed Care, Other (non HMO) | Admitting: Physician Assistant

## 2011-05-09 ENCOUNTER — Encounter (INDEPENDENT_AMBULATORY_CARE_PROVIDER_SITE_OTHER): Payer: Self-pay | Admitting: Physician Assistant

## 2011-05-09 VITALS — BP 122/78 | HR 76 | Resp 16 | Ht 63.0 in | Wt 225.2 lb

## 2011-05-09 DIAGNOSIS — Z4651 Encounter for fitting and adjustment of gastric lap band: Secondary | ICD-10-CM

## 2011-05-09 NOTE — Progress Notes (Signed)
  HISTORY: DOMENICA WEIGHTMAN is a 50 y.o.female who received an AP-Standard lap-band in March 2012 by Dr. Daphine Deutscher. She comes in complaining of daily vomiting, mostly at night. She's intolerant of solids in the early morning as expected but even at lunch she has difficulty tolerating solid food. She has been reaching for crackers and other foods that "go down" as a result. She's having difficulty getting her protein requirements.  VITAL SIGNS: There were no vitals filed for this visit.  PHYSICAL EXAM: Physical exam reveals a very well-appearing 50 y.o.female in no apparent distress Neurologic: Awake, alert, oriented Psych: Bright affect, conversant Respiratory: Breathing even and unlabored. No stridor or wheezing Abdomen: Soft, nontender, nondistended to palpation. Incisions well-healed. No incisional hernias. Port easily palpated. Extremities: Atraumatic, good range of motion.  ASSESMENT: 50 y.o.  female  s/p AP-Standard lap-band.   PLAN: The patient's port was accessed with a 20G Huber needle without difficulty. Clear fluid was aspirated and 0.25 mL saline was removed from the port. I suggested she take liquids tonight then advance to solids tomorrow. Return in 4-6 weeks or sooner especially if solid intolerance persists.

## 2011-05-09 NOTE — Patient Instructions (Signed)
Take liquids today then advance to solids. Return in 4-6 weeks or earlier, especially if you notice persistent intolerance of solid food.

## 2011-05-22 ENCOUNTER — Encounter: Payer: Managed Care, Other (non HMO) | Attending: General Surgery | Admitting: *Deleted

## 2011-05-22 DIAGNOSIS — Z01818 Encounter for other preprocedural examination: Secondary | ICD-10-CM | POA: Insufficient documentation

## 2011-05-22 DIAGNOSIS — Z713 Dietary counseling and surveillance: Secondary | ICD-10-CM | POA: Insufficient documentation

## 2011-05-22 NOTE — Patient Instructions (Signed)
Goals:  Follow Phase 3B: High Protein + Non-Starchy Vegetables  Eat 3-6 small meals/snacks, every 3-5 hrs  Increase lean protein foods to meet 60-75g goal  Increase fluid intake to 64oz +  Add 15 grams of carbohydrate (fruit, whole grain, starchy vegetable) with meals  Avoid drinking 15 minutes before, during and 30 minutes after eating  Aim for >30 min of physical activity daily 

## 2011-05-22 NOTE — Progress Notes (Signed)
  Follow-up visit: 6 Months Post-Operative LAGB Surgery  Medical Nutrition Therapy:  Appt start time: 0832 end time:  0900.  Assessment:  Primary concerns today: post-operative bariatric surgery nutrition management. Princella repots that over the past 2 months she has struggled with her band feeling too tight. She recounts multiple episodes each day of regurgitation. In speaking with pt it seems it may be related to lack of chewing and eating too fast. Pt had 0.25 cc removed per Mardelle Matte and now feels like she can really eat more. She notes increased intake of starchy foods and relies on her 52g protein shake to meet her protein needs. Recommended pt drink 1/2 of her shake (26g) at one sitting and the other 1/2 at another time during the day for increased absorption. Pt reports that she will be moving to [place], [place] yet plans to continue being seen in [place].  Weight today: 226.1 lbs Weight change: No weight change Total weight lost: 22.4 lbs total BMI: 40.1 Weight goal: 180 lbs  24-hr recall:  B (7-8 AM): Protein (powder) shake (52g) Snk (10  AM): Hummus (1 cup), 15 pretzels   L (12 PM): Sandwich w/ chips (2 pc bread, 15 chips) OR Salmon fillet w/ 1/2 cup fried okra Snk (PM): N/A  D (6-8 PM): Fried chicken, 1/2 cup vegetable Snk (10 PM): N/A "yet feeling VERY hungry"  Fluid intake: protein shake, water = 64 oz Estimated total protein intake: 75g  Medications: No rx medications Supplementation: Taking MVI and Calcium regularly  Using straws: No Drinking while eating:No Hair loss: No Carbonated beverages: No N/V/D/C: No Last Lap-Band fill: Band fill removed 9/28  Recent physical activity:  No structured exercise in the past 3 months yet just started a [employer] (4 times/week for 60 minutes)  Progress Towards Goal(s):  In progress.   Nutritional Diagnosis:  [place]calfe-3.3 Overweight/obesity As related to recent LAGB surgery.  As evidenced by pt attempting to follow LAGB dietary  guidelines for continued weight loss.    Intervention:  Nutrition education.  Monitoring/Evaluation:  Dietary intake, exercise, lap band fills, and body weight. Follow up in 3 months for 6 month post-op visit.

## 2011-06-13 ENCOUNTER — Encounter (INDEPENDENT_AMBULATORY_CARE_PROVIDER_SITE_OTHER): Payer: Managed Care, Other (non HMO)

## 2011-08-07 ENCOUNTER — Encounter (INDEPENDENT_AMBULATORY_CARE_PROVIDER_SITE_OTHER): Payer: Self-pay | Admitting: Surgery

## 2011-08-08 ENCOUNTER — Ambulatory Visit (INDEPENDENT_AMBULATORY_CARE_PROVIDER_SITE_OTHER): Payer: Managed Care, Other (non HMO) | Admitting: Physician Assistant

## 2011-08-08 ENCOUNTER — Encounter (INDEPENDENT_AMBULATORY_CARE_PROVIDER_SITE_OTHER): Payer: Self-pay

## 2011-08-08 VITALS — BP 132/84 | HR 68 | Temp 97.2°F | Resp 16 | Ht 63.0 in | Wt 227.6 lb

## 2011-08-08 DIAGNOSIS — Z4651 Encounter for fitting and adjustment of gastric lap band: Secondary | ICD-10-CM

## 2011-08-08 NOTE — Progress Notes (Signed)
  HISTORY: Susan Lamb is a 50 y.o.female who received an AP-Standard lap-band in March 2012 by Dr. Daphine Deutscher. She has had increased hunger and portion sizes since her last visit - fluid was removed due to excessive regurgitation. She's seen Amy in nutrition since our last visit as well. She feels that she could use an adjustment.  VITAL SIGNS: Filed Vitals:   08/08/11 0834  BP: 132/84  Pulse: 68  Temp: 97.2 F (36.2 C)  Resp: 16    PHYSICAL EXAM: Physical exam reveals a very well-appearing 50 y.o.female in no apparent distress Neurologic: Awake, alert, oriented Psych: Bright affect, conversant Respiratory: Breathing even and unlabored. No stridor or wheezing Abdomen: Soft, nontender, nondistended to palpation. Incisions well-healed. No incisional hernias. Port easily palpated. Extremities: Atraumatic, good range of motion.  ASSESMENT: 50 y.o.  female  s/p AP-Standard lap-band.   PLAN: The patient's port was accessed with a 20G Huber needle without difficulty. Clear fluid was aspirated and 0.3 mL saline was added to the port. The patient was able to swallow water without difficulty following the procedure and was instructed to take clear liquids for the next 24-48 hours and advance slowly as tolerated.

## 2011-08-08 NOTE — Patient Instructions (Signed)
Take clear liquids for the next 48 hours. Thin protein shakes are ok to start on Saturday evening. Call us if you have persistent vomiting or regurgitation, night cough or reflux symptoms. Return as scheduled or sooner if you notice no changes in hunger/portion sizes.   

## 2011-09-08 ENCOUNTER — Ambulatory Visit: Payer: Managed Care, Other (non HMO) | Admitting: *Deleted

## 2011-09-12 ENCOUNTER — Encounter (INDEPENDENT_AMBULATORY_CARE_PROVIDER_SITE_OTHER): Payer: Managed Care, Other (non HMO)

## 2011-09-15 ENCOUNTER — Ambulatory Visit: Payer: Managed Care, Other (non HMO) | Admitting: *Deleted

## 2011-11-06 ENCOUNTER — Encounter (INDEPENDENT_AMBULATORY_CARE_PROVIDER_SITE_OTHER): Payer: Managed Care, Other (non HMO)

## 2011-11-10 ENCOUNTER — Encounter (INDEPENDENT_AMBULATORY_CARE_PROVIDER_SITE_OTHER): Payer: Self-pay | Admitting: Surgery

## 2011-11-12 ENCOUNTER — Ambulatory Visit (INDEPENDENT_AMBULATORY_CARE_PROVIDER_SITE_OTHER): Payer: Managed Care, Other (non HMO) | Admitting: Surgery

## 2011-11-12 ENCOUNTER — Encounter (INDEPENDENT_AMBULATORY_CARE_PROVIDER_SITE_OTHER): Payer: Self-pay | Admitting: Surgery

## 2011-11-12 VITALS — BP 124/76 | HR 68 | Temp 97.8°F | Resp 18 | Ht 63.0 in | Wt 217.0 lb

## 2011-11-12 DIAGNOSIS — Z9884 Bariatric surgery status: Secondary | ICD-10-CM | POA: Insufficient documentation

## 2011-11-12 NOTE — Progress Notes (Signed)
Susann [place] Body mass index is 38.44 kg/(m^2).  Having regurgitation:  Yes.nocturnal  Nocturnal reflux?  yes  Amount of fill  -0.1 Ms. Welles is having nocturnal reflux. Sometimes she gets heartburn. And he added 0.3 cc on her last trip here. I went ahead and elected to remove 0.1 cc today  To see if that will improve her symptoms. I also mentioned that she may want to watch her salt intake has more salt could make her band tighter.    Plan return 6 weeks to see either me or M.D.

## 2011-12-04 ENCOUNTER — Other Ambulatory Visit: Payer: Self-pay | Admitting: Obstetrics and Gynecology

## 2011-12-04 DIAGNOSIS — Z1231 Encounter for screening mammogram for malignant neoplasm of breast: Secondary | ICD-10-CM

## 2011-12-18 ENCOUNTER — Ambulatory Visit (INDEPENDENT_AMBULATORY_CARE_PROVIDER_SITE_OTHER): Payer: Managed Care, Other (non HMO) | Admitting: Physician Assistant

## 2011-12-18 ENCOUNTER — Encounter (INDEPENDENT_AMBULATORY_CARE_PROVIDER_SITE_OTHER): Payer: Self-pay

## 2011-12-18 NOTE — Patient Instructions (Signed)
Return in one month.  

## 2011-12-18 NOTE — Progress Notes (Signed)
  HISTORY: Susan Lamb is a 51 y.o.female who received an AP-Standard lap-band in March 2012 by Dr. Daphine Deutscher. She comes in today disappointed with her weight loss in the past year. It sounds like since her last visit with Dr. Daphine Deutscher (who removed some fluid), she's not had any persistent GERD issues but finds herself hungry at night. She eats small amounts by day and her hunger is well controlled.  VITAL SIGNS: Filed Vitals:   12/18/11 0940  BP: 124/78    PHYSICAL EXAM: Physical exam reveals a very well-appearing 51 y.o.female in no apparent distress Neurologic: Awake, alert, oriented Psych: Bright affect, conversant Respiratory: Breathing even and unlabored. No stridor or wheezing Extremities: Atraumatic, good range of motion. Skin: Warm, Dry, no rashes Musculoskeletal: Normal gait, Joints normal  ASSESMENT: 51 y.o.  female  s/p AP-Standard lap-band.   PLAN: We deferred an adjustment as it appears that she's in the green zone and additional fluid would put her into the red zone. We talked about her eating more in the evening and the causative factors. We agreed to keep an eye on things for the next month then return to the clinic.

## 2012-01-07 ENCOUNTER — Ambulatory Visit (HOSPITAL_COMMUNITY)
Admission: RE | Admit: 2012-01-07 | Discharge: 2012-01-07 | Disposition: A | Discharge status: Home / Self Care | Payer: Managed Care, Other (non HMO) | Source: Ambulatory Visit | Attending: Obstetrics and Gynecology | Admitting: Obstetrics and Gynecology

## 2012-01-07 DIAGNOSIS — Z1231 Encounter for screening mammogram for malignant neoplasm of breast: Secondary | ICD-10-CM

## 2012-01-08 ENCOUNTER — Ambulatory Visit (INDEPENDENT_AMBULATORY_CARE_PROVIDER_SITE_OTHER): Payer: Managed Care, Other (non HMO) | Admitting: Family Medicine

## 2012-01-08 ENCOUNTER — Encounter: Payer: Self-pay | Admitting: Family Medicine

## 2012-01-08 VITALS — BP 112/74 | HR 83 | Temp 97.4°F | Wt 225.6 lb

## 2012-01-08 DIAGNOSIS — N760 Acute vaginitis: Secondary | ICD-10-CM

## 2012-01-08 DIAGNOSIS — N898 Other specified noninflammatory disorders of vagina: Secondary | ICD-10-CM

## 2012-01-08 MED ORDER — METRONIDAZOLE 0.75 % VA GEL: VAGINAL | Status: DC

## 2012-01-08 MED ORDER — FLUCONAZOLE 150 MG PO TABS: ORAL_TABLET | ORAL | Status: DC

## 2012-01-08 NOTE — Patient Instructions (Signed)
Bacterial Vaginosis Bacterial vaginosis (BV) is a vaginal infection where the normal balance of bacteria in the vagina is disrupted. The normal balance is then replaced by an overgrowth of certain bacteria. There are several different kinds of bacteria that can cause BV. BV is the most common vaginal infection in women of childbearing age. CAUSES   The cause of BV is not fully understood. BV develops when there is an increase or imbalance of harmful bacteria.   Some activities or behaviors can upset the normal balance of bacteria in the vagina and put women at increased risk including:   Having a new sex partner or multiple sex partners.   Douching.   Using an intrauterine device (IUD) for contraception.   It is not clear what role sexual activity plays in the development of BV. However, women that have never had sexual intercourse are rarely infected with BV.  Women do not get BV from toilet seats, bedding, swimming pools or from touching objects around them.  SYMPTOMS   Grey vaginal discharge.   A fish-like odor with discharge, especially after sexual intercourse.   Itching or burning of the vagina and vulva.   Burning or pain with urination.   Some women have no signs or symptoms at all.  DIAGNOSIS  Your caregiver must examine the vagina for signs of BV. Your caregiver will perform lab tests and look at the sample of vaginal fluid through a microscope. They will look for bacteria and abnormal cells (clue cells), a pH test higher than 4.5, and a positive amine test all associated with BV.  RISKS AND COMPLICATIONS   Pelvic inflammatory disease (PID).   Infections following gynecology surgery.   Developing HIV.   Developing herpes virus.  TREATMENT  Sometimes BV will clear up without treatment. However, all women with symptoms of BV should be treated to avoid complications, especially if gynecology surgery is planned. Female partners generally do not need to be treated. However,  BV may spread between female sex partners so treatment is helpful in preventing a recurrence of BV.   BV may be treated with antibiotics. The antibiotics come in either pill or vaginal cream forms. Either can be used with nonpregnant or pregnant women, but the recommended dosages differ. These antibiotics are not harmful to the baby.   BV can recur after treatment. If this happens, a second round of antibiotics will often be prescribed.   Treatment is important for pregnant women. If not treated, BV can cause a premature delivery, especially for a pregnant woman who had a premature birth in the past. All pregnant women who have symptoms of BV should be checked and treated.   For chronic reoccurrence of BV, treatment with a type of prescribed gel vaginally twice a week is helpful.  HOME CARE INSTRUCTIONS   Finish all medication as directed by your caregiver.   Do not have sex until treatment is completed.   Tell your sexual partner that you have a vaginal infection. They should see their caregiver and be treated if they have problems, such as a mild rash or itching.   Practice safe sex. Use condoms. Only have 1 sex partner.  PREVENTION  Basic prevention steps can help reduce the risk of upsetting the natural balance of bacteria in the vagina and developing BV:  Do not have sexual intercourse (be abstinent).   Do not douche.   Use all of the medicine prescribed for treatment of BV, even if the signs and symptoms go away.     Tell your sex partner if you have BV. That way, they can be treated, if needed, to prevent reoccurrence.  SEEK MEDICAL CARE IF:   Your symptoms are not improving after 3 days of treatment.   You have increased discharge, pain, or fever.  MAKE SURE YOU:   Understand these instructions.   Will watch your condition.   Will get help right away if you are not doing well or get worse.  FOR MORE INFORMATION  Division of STD Prevention (DSTDP), Centers for Disease  Control and Prevention: www.cdc.gov/std American Social Health Association (ASHA): www.ashastd.org  Document Released: 07/28/2005 Document Revised: 07/17/2011 Document Reviewed: 01/18/2009 Exit[employer] Patient Information 2012 [employer]. 

## 2012-01-08 NOTE — Progress Notes (Signed)
  Subjective:    Patient ID: Susan Lamb, female    DOB: [birth year], 51 y.o.   MRN: [id number]  HPI Pt here c/o vaginal d/c with odor.  No abd pain or fever.     Review of Systems As above    Objective:   Physical Exam  Constitutional: She appears well-developed and well-nourished.  Abdominal: Soft. She exhibits no distension. There is no tenderness.  Genitourinary: Vaginal discharge found.       + white d/c , thick with odor No ext lesions  No int lesions Cultures done  Psychiatric: She has a normal mood and affect. Her behavior is normal. Judgment and thought content normal.          Assessment & Plan:

## 2012-01-08 NOTE — Assessment & Plan Note (Signed)
prob BV metrogel diflucan

## 2012-01-12 ENCOUNTER — Other Ambulatory Visit: Payer: Self-pay | Admitting: *Deleted

## 2012-01-12 MED ORDER — METRONIDAZOLE 500 MG PO TABS: 500.0000 mg | ORAL_TABLET | Freq: Two times a day (BID) | ORAL | Status: AC

## 2012-01-15 ENCOUNTER — Encounter (INDEPENDENT_AMBULATORY_CARE_PROVIDER_SITE_OTHER): Payer: Managed Care, Other (non HMO)

## 2012-03-02 ENCOUNTER — Encounter: Payer: Self-pay | Admitting: Obstetrics and Gynecology

## 2012-03-26 ENCOUNTER — Ambulatory Visit: Payer: Managed Care, Other (non HMO) | Admitting: Family Medicine

## 2012-04-05 ENCOUNTER — Ambulatory Visit (INDEPENDENT_AMBULATORY_CARE_PROVIDER_SITE_OTHER): Payer: Private Health Insurance - Indemnity | Admitting: Obstetrics and Gynecology

## 2012-04-05 ENCOUNTER — Encounter: Payer: Self-pay | Admitting: Obstetrics and Gynecology

## 2012-04-05 VITALS — BP 124/64 | Ht 63.0 in | Wt 228.0 lb

## 2012-04-05 DIAGNOSIS — B9689 Other specified bacterial agents as the cause of diseases classified elsewhere: Secondary | ICD-10-CM | POA: Insufficient documentation

## 2012-04-05 DIAGNOSIS — N83209 Unspecified ovarian cyst, unspecified side: Secondary | ICD-10-CM | POA: Insufficient documentation

## 2012-04-05 DIAGNOSIS — N840 Polyp of corpus uteri: Secondary | ICD-10-CM | POA: Insufficient documentation

## 2012-04-05 DIAGNOSIS — N920 Excessive and frequent menstruation with regular cycle: Secondary | ICD-10-CM | POA: Insufficient documentation

## 2012-04-05 DIAGNOSIS — B009 Herpesviral infection, unspecified: Secondary | ICD-10-CM | POA: Insufficient documentation

## 2012-04-05 DIAGNOSIS — L732 Hidradenitis suppurativa: Secondary | ICD-10-CM

## 2012-04-05 DIAGNOSIS — D219 Benign neoplasm of connective and other soft tissue, unspecified: Secondary | ICD-10-CM | POA: Insufficient documentation

## 2012-04-05 MED ORDER — CEPHALEXIN 500 MG PO CAPS: ORAL_CAPSULE | ORAL | Status: DC

## 2012-04-05 NOTE — Progress Notes (Signed)
Subjective:  AEX:  Last Pap:  WNL: Yes Regular Periods:no Contraception: HYSTERECTOMY  Monthly Breast exam:yes Tetanus<55yrs:yes Nl.Bladder Function:yes Daily BMs:yes Healthy Diet:yes Calcium:yes Mammogram:yes Date of Mammogram: 02/27/2012 Exercise:yes Have often Exercise: 3 times weekly Seatbelt: yes Abuse at home: no Stressful work:yes Sigmoid-colonoscopy: n/a Bone Density: No PCP: Dr. Laury Axon Change in PMH: None Change in [facility]: None    Susan Lamb is a 51 y.o. female [phone] who presents for annual exam.  The patient has no complaints today.   The following portions of the patient's history were reviewed and updated as appropriate: allergies, current medications, past family history, past medical history, past social history, past surgical history and problem list.  Review of Systems Pertinent items are noted in HPI. Gastrointestinal:No change in bowel habits, no abdominal pain, no rectal bleeding Genitourinary:negative for dysuria, frequency, hematuria, nocturia and urinary incontinence    Objective:     BP 124/64  Ht 5\' 3"  (1.6 m)  Wt 228 lb (103.42 kg)  BMI 40.39 kg/m2  Weight:  Wt Readings from Last 1 Encounters:  04/05/12 228 lb (103.42 kg)     BMI: Body mass index is 40.39 kg/(m^2). General Appearance: Alert, appropriate appearance for age. No acute distress HEENT: Grossly normal Neck / Thyroid: Supple, no masses, nodes or enlargement Lungs: clear to auscultation bilaterally Back: No CVA tenderness Breast Exam: There is a tender subcutaneous 5mm lesion below the right breast with no drainage. and No masses or nodes.No dimpling, nipple retraction or discharge. Cardiovascular: Regular rate and rhythm. S1, S2, no murmur Gastrointestinal: Soft, non-tender, no masses or organomegaly Pelvic Exam: Vaginal: normal mucosa without prolapse or lesions and vaginal vault, well healed Cervix: removed surgically Adnexa: no palpable masses Uterus: removed  surgically EGBUS nl Exam is limited by body habitus Rectovaginal: normal rectal, no masses Lymphatic Exam: Non-palpable nodes in neck, clavicular, axillary, or inguinal regions  Skin: no rash or abnormalities Neurologic: Normal gait and speech, no tremor  Psychiatric: Alert and oriented, appropriate affect.    Urinalysis:Not done    Assessment:    S/P hysterectomy with nl exam  Submammary hidradenitis   Plan:  Local heat to hidradenitis.  Keflex 500mg  qid for 7 days if no relief. mammogram return annually or prn Will need colonoscopy age 46 Follow-up:  for annual exam

## 2012-04-21 ENCOUNTER — Telehealth: Payer: Self-pay

## 2012-04-21 NOTE — Telephone Encounter (Signed)
Tc from pt. Pt would like to have a rx for Valtrex mailed to her in a 90 d supply. Last AEX 04/05/12 with vh. Rx for Valtrex 500mg  1tab po daily #90 with 3 rf's mailed to pt. Pt agrees.

## 2012-06-03 ENCOUNTER — Encounter (INDEPENDENT_AMBULATORY_CARE_PROVIDER_SITE_OTHER): Payer: Self-pay

## 2012-06-03 ENCOUNTER — Ambulatory Visit (INDEPENDENT_AMBULATORY_CARE_PROVIDER_SITE_OTHER): Payer: Private Health Insurance - Indemnity | Admitting: Physician Assistant

## 2012-06-03 VITALS — BP 120/84 | HR 84 | Resp 24 | Ht 63.0 in | Wt 229.0 lb

## 2012-06-03 DIAGNOSIS — Z4651 Encounter for fitting and adjustment of gastric lap band: Secondary | ICD-10-CM

## 2012-06-03 NOTE — Progress Notes (Signed)
  HISTORY: Susan Lamb is a 51 y.o.female who received an AP-Standard lap-band in March 2012 by Dr. Daphine Deutscher. She was last seen in May of this year and has gained 7 lbs since her last visit. She complains of hunger and larger than desired portion sizes. She says she has regurgitation only when she doesn't pay attention to her eating. She would like an adjustment today.  VITAL SIGNS: Filed Vitals:   06/03/12 0928  BP: 120/84  Pulse: 84  Resp: 24    PHYSICAL EXAM: Physical exam reveals a very well-appearing 51 y.o.female in no apparent distress Neurologic: Awake, alert, oriented Psych: Bright affect, conversant Respiratory: Breathing even and unlabored. No stridor or wheezing Abdomen: Soft, nontender, nondistended to palpation. Incisions well-healed. No incisional hernias. Port easily palpated. Extremities: Atraumatic, good range of motion.  ASSESMENT: 51 y.o.  female  s/p AP-Standard lap-band.   PLAN: The patient's port was accessed with a 20G Huber needle without difficulty. Clear fluid was aspirated and 0.5 mL saline was added to the port. The patient was able to swallow water without difficulty following the procedure and was instructed to take clear liquids for the next 24-48 hours and advance slowly as tolerated. She has moved to [place], [place] but travels to [place] occasionally. She's scheduled to return the week of Thanksgiving so she would like to be seen in clinic then if possible. She also asked for a Rx to help her obtain exercise equipment.

## 2012-06-03 NOTE — Patient Instructions (Signed)
Take clear liquids tonight. Thin protein shakes are ok to start tomorrow morning. Slowly advance your diet thereafter. Call us if you have persistent vomiting or regurgitation, night cough or reflux symptoms. Return as scheduled or sooner if you notice no changes in hunger/portion sizes.  

## 2012-07-01 ENCOUNTER — Encounter (INDEPENDENT_AMBULATORY_CARE_PROVIDER_SITE_OTHER): Payer: Private Health Insurance - Indemnity

## 2012-12-15 ENCOUNTER — Ambulatory Visit (INDEPENDENT_AMBULATORY_CARE_PROVIDER_SITE_OTHER): Payer: 59 | Admitting: Family Medicine

## 2012-12-15 ENCOUNTER — Encounter: Payer: Self-pay | Admitting: Family Medicine

## 2012-12-15 VITALS — BP 118/78 | HR 87 | Temp 98.4°F | Ht 62.75 in | Wt 233.0 lb

## 2012-12-15 DIAGNOSIS — N898 Other specified noninflammatory disorders of vagina: Secondary | ICD-10-CM

## 2012-12-15 DIAGNOSIS — L293 Anogenital pruritus, unspecified: Secondary | ICD-10-CM

## 2012-12-15 NOTE — Progress Notes (Signed)
  Subjective:    Patient ID: Susan Lamb, female    DOB: 01/01/1961, 52 y.o.   MRN: [id number]  HPI Vaginal itching- itching is exterior, pt got some stool in her vagina when ill w/ GI virus.  Pt reports she excessively washed the area and over the next few days had redness and itching.  No discharge, odor.  No dysuria, frequency, urgency.   Review of Systems For ROS see HPI     Objective:   Physical Exam  Vitals reviewed. Constitutional: She appears well-developed and well-nourished. No distress.  Genitourinary: There is no rash, tenderness, lesion or injury on the right labia. There is no rash, tenderness, lesion or injury on the left labia. No erythema or tenderness around the vagina. No vaginal discharge found.          Assessment & Plan:

## 2012-12-15 NOTE — Patient Instructions (Addendum)
This appears to be more irritation than yeast but we'll wait for the labs to come back Try and avoid aggressive scrubbing or cleaning in the future Call with any questions or concerns- particularly if symptoms change or worsen Hang in there!

## 2012-12-15 NOTE — Assessment & Plan Note (Signed)
New.  Suspect external irritation from aggressive washing but will check wet prep to r/o possible yeast.  Reviewed supportive care and red flags that should prompt return.  Pt expressed understanding and is in agreement w/ plan.

## 2012-12-16 ENCOUNTER — Telehealth: Payer: Self-pay | Admitting: *Deleted

## 2012-12-16 LAB — WET PREP BY MOLECULAR PROBE
Candida species: NEGATIVE
Gardnerella vaginalis: POSITIVE — AB
Trichomonas vaginosis: NEGATIVE

## 2012-12-16 MED ORDER — METRONIDAZOLE 500 MG PO TABS: 500.0000 mg | ORAL_TABLET | Freq: Two times a day (BID) | ORAL | Status: DC

## 2012-12-16 NOTE — Telephone Encounter (Signed)
Spoke with the pt and informed her of recent lab results and note.  Pt understood and agreed.  New rx sent to the pharmacy by e-script.//AB/CMA 

## 2012-12-16 NOTE — Telephone Encounter (Signed)
Message copied by Verdie Shire on Thu Dec 16, 2012  9:02 AM ------      Message from: Sheliah Hatch      Created: Thu Dec 16, 2012  8:53 AM       + for BV.  Should start flagyl 500mg  BID x7 days ------

## 2012-12-24 ENCOUNTER — Telehealth: Payer: Self-pay | Admitting: *Deleted

## 2012-12-24 MED ORDER — FLUCONAZOLE 150 MG PO TABS: 150.0000 mg | ORAL_TABLET | Freq: Once | ORAL | Status: DC

## 2012-12-24 NOTE — Telephone Encounter (Addendum)
Pt seen on 12-15-12 and Dx with BV, and was Rx metroNIDAZOLE. Pt now has a yeast infection and would like to get something for this.Please advise

## 2012-12-24 NOTE — Telephone Encounter (Addendum)
Called left VM on Pt cell phone that Rx have been sent to pharmacy. Pt is currently in [place] advise her to see if [facility] can transfer it and if not give [place] a call back and we can get it sent in to the correct pharmacy. Advise Pt office is closed but we do have a on call service that can assist her with the Rx if needed.

## 2012-12-24 NOTE — Telephone Encounter (Signed)
Left detailed msg making pt aware abx has been sent to pharmacy.

## 2012-12-24 NOTE — Telephone Encounter (Signed)
Diflucan 150 mg 1 po qd x 2 #2, no RF

## 2013-03-01 ENCOUNTER — Other Ambulatory Visit: Payer: Self-pay | Admitting: Obstetrics and Gynecology

## 2013-03-01 DIAGNOSIS — Z1231 Encounter for screening mammogram for malignant neoplasm of breast: Secondary | ICD-10-CM

## 2013-03-10 ENCOUNTER — Ambulatory Visit (HOSPITAL_COMMUNITY): Payer: Managed Care, Other (non HMO)

## 2013-04-15 ENCOUNTER — Ambulatory Visit (HOSPITAL_COMMUNITY)
Admission: RE | Admit: 2013-04-15 | Discharge: 2013-04-15 | Disposition: A | Discharge status: Home / Self Care | Payer: Managed Care, Other (non HMO) | Source: Ambulatory Visit | Attending: Obstetrics and Gynecology | Admitting: Obstetrics and Gynecology

## 2013-04-15 DIAGNOSIS — Z1231 Encounter for screening mammogram for malignant neoplasm of breast: Secondary | ICD-10-CM | POA: Insufficient documentation

## 2013-12-26 ENCOUNTER — Ambulatory Visit (INDEPENDENT_AMBULATORY_CARE_PROVIDER_SITE_OTHER): Payer: Managed Care, Other (non HMO) | Admitting: Nurse Practitioner

## 2013-12-26 ENCOUNTER — Encounter: Payer: Self-pay | Admitting: Nurse Practitioner

## 2013-12-26 DIAGNOSIS — IMO0002 Reserved for concepts with insufficient information to code with codable children: Secondary | ICD-10-CM

## 2013-12-26 DIAGNOSIS — S39012A Strain of muscle, fascia and tendon of lower back, initial encounter: Secondary | ICD-10-CM

## 2013-12-26 MED ORDER — METHYLPREDNISOLONE ACETATE 40 MG/ML IJ SUSP
40.0000 mg | Freq: Once | INTRAMUSCULAR | Status: AC
  Administered 2013-12-26: 40 mg via INTRAMUSCULAR

## 2013-12-26 MED ORDER — CYCLOBENZAPRINE HCL 10 MG PO TABS: 10.0000 mg | ORAL_TABLET | Freq: Every evening | ORAL | Status: DC | PRN

## 2013-12-26 MED ORDER — PREDNISONE 10 MG PO TABS: ORAL_TABLET | ORAL | Status: DC

## 2013-12-26 NOTE — Patient Instructions (Signed)
Take muscle relaxer at night Do not drive or operating machinery while taking. Start prednisone tomorrow. Take 1000 mg tylenol with 400 mg ibuprophen or 1 tablet aleve 2 times daily for several days if needed for pain. You may Use heating pad on lower back for 15-20 minutes several times dAILY. Daily back stretches. Hold positions for 10 seconds, repeat 5 times. Do 2-3 times daily.  Avoid twisting trunk: golfers lift, let toes lead you when turning. No lifting over 10 pounds or resistance training for 2 weeks. When carrying objects, hold close to body to decrease strain. Avoid prolonged sitting-move around. Please return in 2 weeks to re-evaluate or sooner if no improvement.  Lumbosacral Strain Lumbosacral strain is one of the most common causes of back pain. There are many causes of back pain. Most are not serious conditions. CAUSES  Your backbone (spinal column) is made up of 24 main vertebral bodies, the sacrum, and the coccyx. These are held together by muscles and tough, fibrous tissue (ligaments). Nerve roots pass through the openings between the vertebrae. A sudden move or injury to the back may cause injury to, or pressure on, these nerves. This may result in localized back pain or pain movement (radiation) into the buttocks, down the leg, and into the foot. Sharp, shooting pain from the buttock down the back of the leg (sciatica) is frequently associated with a ruptured (herniated) disk. Pain may be caused by muscle spasm alone. Your caregiver can often find the cause of your pain by the details of your symptoms and an exam. In some cases, you may need tests (such as X-rays). Your caregiver will work with you to decide if any tests are needed based on your specific exam. HOME CARE INSTRUCTIONS   Avoid an underactive lifestyle. Active exercise, as directed by your caregiver, is your greatest weapon against back pain.  Avoid hard physical activities (tennis, racquetball, waterskiing) if you are  not in proper physical condition for it. This may aggravate or create problems.  If you have a back problem, avoid sports requiring sudden body movements. Swimming and walking are generally safer activities.  Maintain good posture.  Avoid becoming overweight (obese).  Use bed rest for only the most extreme, sudden (acute) episode. Your caregiver will help you determine how much bed rest is necessary.  For acute conditions, you may put ice on the injured area.  Put ice in a plastic bag.  Place a towel between your skin and the bag.  Leave the ice on for 15-20 minutes at a time, every 2 hours, or as needed.  After you are improved and more active, it may help to apply heat for 30 minutes before activities. See your caregiver if you are having pain that lasts longer than expected. Your caregiver can advise appropriate exercises or therapy if needed. With conditioning, most back problems can be avoided. SEEK IMMEDIATE MEDICAL CARE IF:   You have numbness, tingling, weakness, or problems with the use of your arms or legs.  You experience severe back pain not relieved with medicines.  There is a change in bowel or bladder control.  You have increasing pain in any area of the body, including your belly (abdomen).  You notice shortness of breath, dizziness, or feel faint.  You feel sick to your stomach (nauseous), are throwing up (vomiting), or become sweaty.  You notice discoloration of your toes or legs, or your feet get very cold.  Your back pain is getting worse.  You have  a fever. MAKE SURE YOU:   Understand these instructions.  Will watch your condition.  Will get help right away if you are not doing well or get worse. Document Released: 05/07/2005 Document Revised: 10/20/2011 Document Reviewed: 10/27/2008 [facility] Patient Information 2014 [place], [place].  Back Exercises These exercises may help you when beginning to rehabilitate your injury. Your symptoms may resolve  with or without further involvement from your physician, physical therapist or athletic trainer. While completing these exercises, remember:   Restoring tissue flexibility helps normal motion to return to the joints. This allows healthier, less painful movement and activity.  An effective stretch should be held for at least 30 seconds.  A stretch should never be painful. You should only feel a gentle lengthening or release in the stretched tissue. STRETCH  Flexion, Single Knee to Chest   Lie on a firm bed or floor with both legs extended in front of you.  Keeping one leg in contact with the floor, bring your opposite knee to your chest. Hold your leg in place by either grabbing behind your thigh or at your knee.  Pull until you feel a gentle stretch in your low back. Hold __________ seconds.  Slowly release your grasp and repeat the exercise with the opposite side. Repeat __________ times. Complete this exercise __________ times per day.  STRETCH - Hamstrings, Standing  Stand or sit and extend your right / left leg, placing your foot on a chair or foot stool  Keeping a slight arch in your low back and your hips straight forward.  Lead with your chest and lean forward at the waist until you feel a gentle stretch in the back of your right / left knee or thigh. (When done correctly, this exercise requires leaning only a small distance.)  Hold this position for __________ seconds. Repeat __________ times. Complete this stretch __________ times per day. STRENGTHENING  Deep Abdominals, Pelvic Tilt   Lie on a firm bed or floor. Keeping your legs in front of you, bend your knees so they are both pointed toward the ceiling and your feet are flat on the floor.  Tense your lower abdominal muscles to press your low back into the floor. This motion will rotate your pelvis so that your tail bone is scooping upwards rather than pointing at your feet or into the floor.  With a gentle tension and  even breathing, hold this position for __________ seconds. Repeat __________ times. Complete this exercise __________ times per day.  STRENGTHENING  Quadruped, Opposite UE/LE Lift   Assume a hands and knees position on a firm surface. Keep your hands under your shoulders and your knees under your hips. You may place padding under your knees for comfort.  Find your neutral spine and gently tense your abdominal muscles so that you can maintain this position. Your shoulders and hips should form a rectangle that is parallel with the floor and is not twisted.  Keeping your trunk steady, lift your right hand no higher than your shoulder and then your left leg no higher than your hip. Make sure you are not holding your breath. Hold this position __________ seconds.  Continuing to keep your abdominal muscles tense and your back steady, slowly return to your starting position. Repeat with the opposite arm and leg. Repeat __________ times. Complete this exercise __________ times per day. Document Released: 08/15/2005 Document Revised: 10/20/2011 Document Reviewed: 11/09/2008 [facility] Patient Information 2014 [place], [place].

## 2013-12-26 NOTE — Progress Notes (Signed)
   Subjective:    Patient ID: Susan Lamb, female    DOB: 1960/11/23, 53 y.o.   MRN: [id number]  HPI Comments: Experienced pain while at gym on Friday.  Back Pain This is a recurrent problem. The current episode started in the past 7 days (3d). The problem occurs constantly. The problem is unchanged. The pain is present in the lumbar spine. The quality of the pain is described as aching. The pain radiates to the right thigh. The pain is moderate. The pain is the same all the time. The symptoms are aggravated by bending and twisting. Pertinent negatives include no fever, headaches or weakness. She has tried NSAIDs for the symptoms. The treatment provided mild relief.      Review of Systems  Constitutional: Negative for fever, activity change, appetite change and fatigue.  HENT: Negative for congestion.   Respiratory: Negative for cough.   Gastrointestinal: Negative for nausea and rectal pain.  Genitourinary: Negative for flank pain.  Musculoskeletal: Positive for back pain and gait problem (limping).  Neurological: Negative for weakness and headaches.       Objective:   Physical Exam  Vitals reviewed. Constitutional: She is oriented to person, place, and time. She appears well-developed and well-nourished. No distress.  HENT:  Head: Normocephalic and atraumatic.  Eyes: Conjunctivae are normal. Right eye exhibits no discharge. Left eye exhibits no discharge.  Cardiovascular: Normal rate.   Pulmonary/Chest: Effort normal. No respiratory distress.  Musculoskeletal: She exhibits tenderness. She exhibits no edema.  Point tenderness over lumbar spine & SI joints. Neg straight leg raise sitting & lying.  Neurological: She is alert and oriented to person, place, and time.  Strength equal in bilat LE.  Psychiatric: She has a normal mood and affect. Her behavior is normal. Thought content normal.          Assessment & Plan:  1. Back strain Felt pain while at gym. -  methylPREDNISolone acetate (DEPO-MEDROL) injection 40 mg; Inject 1 mL (40 mg total) into the muscle once. - predniSONE (DELTASONE) 10 MG tablet; Starting tomorrow, Take 4Tpo qam X 2d, then 3T po qam X 3d, then 2T po qd X 3d, then 1T po qam X 3d.  Dispense: 26 tablet; Refill: 0 - cyclobenzaprine (FLEXERIL) 10 MG tablet; Take 1 tablet (10 mg total) by mouth at bedtime as needed for muscle spasms.  Dispense: 30 tablet; Refill: 0 F/u 2 weeks or sooner if no improvement. Will image l-s if no improvement.

## 2014-01-30 ENCOUNTER — Telehealth: Payer: Self-pay | Admitting: *Deleted

## 2014-01-30 NOTE — Telephone Encounter (Signed)
The patient lives in [place] and will be in town on 02/06/14. She wanted to be seen on the 30 th. Apt scheduled for the 30 th of June at 9 am.     KP

## 2014-01-30 NOTE — Telephone Encounter (Signed)
Caller name:  Ladaisha Relation to pt: self Call back number:  [phone] Pharmacy:  Reason for call:   Pt called to see who [facility] would refer her to so she can schedule her routine colonoscopy.  Please advise.  bw

## 2014-01-30 NOTE — Telephone Encounter (Signed)
Pt needs ov. 

## 2014-01-30 NOTE — Telephone Encounter (Signed)
Pt has seen blood in stool for aprox last 2 weeks.

## 2014-01-30 NOTE — Telephone Encounter (Signed)
Patient has not ben see by PCP since 2013. Please advise     KP

## 2014-02-07 ENCOUNTER — Ambulatory Visit: Payer: Managed Care, Other (non HMO) | Admitting: Family Medicine

## 2014-05-09 ENCOUNTER — Other Ambulatory Visit (HOSPITAL_COMMUNITY): Payer: Self-pay | Admitting: Obstetrics and Gynecology

## 2014-05-09 ENCOUNTER — Other Ambulatory Visit: Payer: Self-pay | Admitting: Obstetrics and Gynecology

## 2014-05-09 DIAGNOSIS — R1030 Lower abdominal pain, unspecified: Secondary | ICD-10-CM

## 2014-05-09 DIAGNOSIS — Z1231 Encounter for screening mammogram for malignant neoplasm of breast: Secondary | ICD-10-CM

## 2014-05-12 ENCOUNTER — Ambulatory Visit (HOSPITAL_COMMUNITY)
Admission: RE | Admit: 2014-05-12 | Discharge: 2014-05-12 | Disposition: A | Discharge status: Home / Self Care | Payer: Managed Care, Other (non HMO) | Source: Ambulatory Visit | Attending: Obstetrics and Gynecology | Admitting: Obstetrics and Gynecology

## 2014-05-12 DIAGNOSIS — Z1231 Encounter for screening mammogram for malignant neoplasm of breast: Secondary | ICD-10-CM

## 2014-05-15 ENCOUNTER — Other Ambulatory Visit: Payer: Managed Care, Other (non HMO)

## 2014-05-16 ENCOUNTER — Ambulatory Visit
Admission: RE | Admit: 2014-05-16 | Discharge: 2014-05-16 | Disposition: A | Discharge status: Home / Self Care | Payer: Managed Care, Other (non HMO) | Source: Ambulatory Visit | Attending: Obstetrics and Gynecology | Admitting: Obstetrics and Gynecology

## 2014-05-16 DIAGNOSIS — R1030 Lower abdominal pain, unspecified: Secondary | ICD-10-CM

## 2014-05-16 MED ORDER — IOHEXOL 300 MG/ML  SOLN
125.0000 mL | Freq: Once | INTRAMUSCULAR | Status: AC | PRN
  Administered 2014-05-16: 125 mL via INTRAVENOUS

## 2014-06-07 ENCOUNTER — Other Ambulatory Visit: Payer: Self-pay | Admitting: Obstetrics and Gynecology

## 2014-06-07 DIAGNOSIS — R1900 Intra-abdominal and pelvic swelling, mass and lump, unspecified site: Secondary | ICD-10-CM

## 2014-06-12 ENCOUNTER — Encounter: Payer: Self-pay | Admitting: Nurse Practitioner

## 2014-06-15 ENCOUNTER — Ambulatory Visit
Admission: RE | Admit: 2014-06-15 | Discharge: 2014-06-15 | Disposition: A | Discharge status: Home / Self Care | Payer: Managed Care, Other (non HMO) | Source: Ambulatory Visit | Attending: Obstetrics and Gynecology | Admitting: Obstetrics and Gynecology

## 2014-06-15 DIAGNOSIS — R1900 Intra-abdominal and pelvic swelling, mass and lump, unspecified site: Secondary | ICD-10-CM

## 2014-06-15 MED ORDER — GADOBENATE DIMEGLUMINE 529 MG/ML IV SOLN
20.0000 mL | Freq: Once | INTRAVENOUS | Status: AC | PRN
  Administered 2014-06-15: 20 mL via INTRAVENOUS

## 2014-07-04 ENCOUNTER — Other Ambulatory Visit: Payer: Self-pay | Admitting: Gastroenterology

## 2014-07-04 DIAGNOSIS — R9389 Abnormal findings on diagnostic imaging of other specified body structures: Secondary | ICD-10-CM

## 2014-07-24 ENCOUNTER — Ambulatory Visit
Admission: RE | Admit: 2014-07-24 | Discharge: 2014-07-24 | Disposition: A | Discharge status: Home / Self Care | Payer: 59 | Source: Ambulatory Visit | Attending: Gastroenterology | Admitting: Gastroenterology

## 2014-07-24 DIAGNOSIS — R9389 Abnormal findings on diagnostic imaging of other specified body structures: Secondary | ICD-10-CM

## 2014-07-24 MED ORDER — GADOBENATE DIMEGLUMINE 529 MG/ML IV SOLN
20.0000 mL | Freq: Once | INTRAVENOUS | Status: AC | PRN
  Administered 2014-07-24: 20 mL via INTRAVENOUS

## 2014-09-12 ENCOUNTER — Encounter: Payer: Self-pay | Admitting: Family Medicine

## 2014-09-12 ENCOUNTER — Ambulatory Visit (INDEPENDENT_AMBULATORY_CARE_PROVIDER_SITE_OTHER): Payer: Managed Care, Other (non HMO) | Admitting: Family Medicine

## 2014-09-12 VITALS — BP 130/87 | HR 97 | Temp 98.7°F | Wt 235.3 lb

## 2014-09-12 DIAGNOSIS — R21 Rash and other nonspecific skin eruption: Secondary | ICD-10-CM

## 2014-09-12 DIAGNOSIS — L42 Pityriasis rosea: Secondary | ICD-10-CM

## 2014-09-12 LAB — HEPATIC FUNCTION PANEL
ALBUMIN: 3.8 g/dL (ref 3.5–5.2)
ALK PHOS: 69 U/L (ref 39–117)
ALT: 12 U/L (ref 0–35)
AST: 15 U/L (ref 0–37)
Bilirubin, Direct: 0.2 mg/dL (ref 0.0–0.3)
TOTAL PROTEIN: 7.2 g/dL (ref 6.0–8.3)
Total Bilirubin: 0.5 mg/dL (ref 0.2–1.2)

## 2014-09-12 LAB — CBC WITH DIFFERENTIAL/PLATELET
Basophils Absolute: 0 10*3/uL (ref 0.0–0.1)
Basophils Relative: 0.3 % (ref 0.0–3.0)
EOS ABS: 0.1 10*3/uL (ref 0.0–0.7)
Eosinophils Relative: 1.1 % (ref 0.0–5.0)
HCT: 37.3 % (ref 36.0–46.0)
HEMOGLOBIN: 12.8 g/dL (ref 12.0–15.0)
LYMPHS PCT: 25.1 % (ref 12.0–46.0)
Lymphs Abs: 1.8 10*3/uL (ref 0.7–4.0)
MCHC: 34.2 g/dL (ref 30.0–36.0)
MCV: 91.3 fl (ref 78.0–100.0)
Monocytes Absolute: 0.5 10*3/uL (ref 0.1–1.0)
Monocytes Relative: 7.2 % (ref 3.0–12.0)
NEUTROS PCT: 66.3 % (ref 43.0–77.0)
Neutro Abs: 4.6 10*3/uL (ref 1.4–7.7)
PLATELETS: 329 10*3/uL (ref 150.0–400.0)
RBC: 4.08 Mil/uL (ref 3.87–5.11)
RDW: 13.9 % (ref 11.5–15.5)
WBC: 7 10*3/uL (ref 4.0–10.5)

## 2014-09-12 LAB — SEDIMENTATION RATE: Sed Rate: 38 mm/hr — ABNORMAL HIGH (ref 0–22)

## 2014-09-12 LAB — BASIC METABOLIC PANEL
BUN: 17 mg/dL (ref 6–23)
CHLORIDE: 106 meq/L (ref 96–112)
CO2: 25 mEq/L (ref 19–32)
CREATININE: 0.65 mg/dL (ref 0.40–1.20)
Calcium: 8.9 mg/dL (ref 8.4–10.5)
GFR: 123.35 mL/min (ref >60.00)
GLUCOSE: 79 mg/dL (ref 70–99)
POTASSIUM: 3.9 meq/L (ref 3.5–5.1)
SODIUM: 135 meq/L (ref 135–145)

## 2014-09-12 MED ORDER — PREDNISONE 10 MG PO TABS: ORAL_TABLET | ORAL | Status: DC

## 2014-09-12 NOTE — Progress Notes (Signed)
Pre visit review using our clinic review tool, if applicable. No additional management support is needed unless otherwise documented below in the visit note. 

## 2014-09-12 NOTE — Progress Notes (Signed)
  Subjective:     Susan Lamb is a 54 y.o. female who presents for evaluation of a rash involving the trunk. Rash started 3 days ago. Lesions are pink, and raised in texture. Rash has changed over time. Rash causes no discomfort. Associated symptoms: none. Patient denies: abdominal pain, arthralgia, congestion, cough, crankiness, decrease in appetite, decrease in energy level, fever, headache, irritability, myalgia, nausea, sore throat and vomiting. Patient has not had contacts with similar rash. Patient has not had new exposures (soaps, lotions, laundry detergents, foods, medications, plants, insects or animals).  The following portions of the patient's history were reviewed and updated as appropriate:  She  has a past medical history of Fatigue; Hemorrhoids; Back pain; Contact lens/glasses fitting; Herpes; Ovarian cyst; BV (bacterial vaginosis); Fibroids; Endometrial polyp; and Menorrhagia. She  does not have any pertinent problems on file. She  has past surgical history that includes Abdominal hysterectomy (2000); Dilation and curettage of uterus (1998); Laparoscopic gastric banding (03 20 2012); and Myomectomy. Her family history includes Heart disease in her mother; Hypertension in her father; Stroke in her mother. She  reports that she has never smoked. She has never used smokeless tobacco. She reports that she drinks alcohol. She reports that she does not use illicit drugs. She has a current medication list which includes the following prescription(s): calcium carbonate-vitamin d, cholecalciferol, flaxseed (linseed), lysine, multiple vitamins-minerals, valacyclovir, and vitamin b-12. Current Outpatient Prescriptions on File Prior to Visit  Medication Sig Dispense Refill  . Calcium Carbonate-Vitamin D (CALCIUM + D PO) Take by mouth.      . LYSINE PO Take by mouth.      . Multiple Vitamins-Minerals (ONE-A-DAY VITACRAVES PO) Take by mouth.      . valACYclovir (VALTREX) 500 MG tablet Take 500  mg by mouth as needed.      No current facility-administered medications on file prior to visit.   She has No Known Allergies..  Review of Systems Pertinent items are noted in HPI.    Objective:    BP 130/87 mmHg  Pulse 97  Temp(Src) 98.7 F (37.1 C) (Oral)  Wt 235 lb 5.1 oz (106.74 kg)  SpO2 100% General:  alert, cooperative, appears stated age and no distress  Skin:  papules noted on trunk     Assessment:    pityriasis rosea    Plan:    Written patient instruction given.   F/u if symptoms change or worsen

## 2014-09-12 NOTE — Patient Instructions (Signed)
Pityriasis Rosea  Pityriasis rosea is a rash which is probably caused by a virus. It generally starts as a scaly, red patch on the trunk (the area of the body that a t-shirt would cover) but does not appear on sun exposed areas. The rash is usually preceded by an initial larger spot called the "herald patch" a week or more before the rest of the rash appears. Generally within one to two days the rash appears rapidly on the trunk, upper arms, and sometimes the upper legs. The rash usually appears as flat, oval patches of scaly pink color. The rash can also be raised and one is able to feel it with a finger. The rash can also be finely crinkled and may slough off leaving a ring of scale around the spot. Sometimes a mild sore throat is present with the rash. It usually affects children and young adults in the spring and autumn. Women are more frequently affected than men.  TREATMENT   Pityriasis rosea is a self-limited condition. This means it goes away within 4 to 8 weeks without treatment. The spots may persist for several months, especially in darker-colored skin after the rash has resolved and healed. Benadryl and steroid creams may be used if itching is a problem.  SEEK MEDICAL CARE IF:   · Your rash does not go away or persists longer than three months.  · You develop fever and joint pain.  · You develop severe headache and confusion.  · You develop breathing difficulty, vomiting and/or extreme weakness.  Document Released: 09/03/2001 Document Revised: 10/20/2011 Document Reviewed: 09/22/2008  [employer]® Patient Information ©2015 [employer]. This information is not intended to replace advice given to you by your health care provider. Make sure you discuss any questions you have with your health care provider.

## 2015-03-08 ENCOUNTER — Other Ambulatory Visit (INDEPENDENT_AMBULATORY_CARE_PROVIDER_SITE_OTHER): Payer: Self-pay | Admitting: Physician Assistant

## 2015-03-08 DIAGNOSIS — Z9884 Bariatric surgery status: Secondary | ICD-10-CM

## 2015-03-09 ENCOUNTER — Ambulatory Visit
Admission: RE | Admit: 2015-03-09 | Discharge: 2015-03-09 | Disposition: A | Discharge status: Home / Self Care | Payer: Managed Care, Other (non HMO) | Source: Ambulatory Visit | Attending: Physician Assistant | Admitting: Physician Assistant

## 2015-03-09 DIAGNOSIS — Z9884 Bariatric surgery status: Secondary | ICD-10-CM

## 2015-12-06 ENCOUNTER — Other Ambulatory Visit (INDEPENDENT_AMBULATORY_CARE_PROVIDER_SITE_OTHER): Payer: Self-pay | Admitting: Physician Assistant

## 2015-12-06 DIAGNOSIS — Z9884 Bariatric surgery status: Secondary | ICD-10-CM

## 2015-12-12 ENCOUNTER — Ambulatory Visit
Admission: RE | Admit: 2015-12-12 | Discharge: 2015-12-12 | Disposition: A | Discharge status: Home / Self Care | Payer: Managed Care, Other (non HMO) | Source: Ambulatory Visit | Attending: Physician Assistant | Admitting: Physician Assistant

## 2015-12-12 DIAGNOSIS — Z9884 Bariatric surgery status: Secondary | ICD-10-CM

## 2017-06-02 ENCOUNTER — Encounter (HOSPITAL_COMMUNITY): Payer: Self-pay

## 2017-07-13 ENCOUNTER — Other Ambulatory Visit (HOSPITAL_COMMUNITY): Payer: Self-pay | Admitting: Surgery

## 2017-09-01 ENCOUNTER — Ambulatory Visit (INDEPENDENT_AMBULATORY_CARE_PROVIDER_SITE_OTHER): Payer: Managed Care, Other (non HMO) | Admitting: Family Medicine

## 2017-09-01 ENCOUNTER — Encounter: Payer: Self-pay | Admitting: Family Medicine

## 2017-09-01 ENCOUNTER — Ambulatory Visit: Payer: 59 | Admitting: Psychiatry

## 2017-09-01 VITALS — BP 144/87 | HR 73 | Temp 98.1°F | Resp 16 | Ht 63.0 in | Wt 221.6 lb

## 2017-09-01 DIAGNOSIS — Z0000 Encounter for general adult medical examination without abnormal findings: Secondary | ICD-10-CM | POA: Diagnosis not present

## 2017-09-01 DIAGNOSIS — Z23 Encounter for immunization: Secondary | ICD-10-CM

## 2017-09-01 LAB — POC URINALSYSI DIPSTICK (AUTOMATED)
Bilirubin, UA: NEGATIVE
Blood, UA: NEGATIVE
Glucose, UA: NEGATIVE
Ketones, UA: NEGATIVE
LEUKOCYTES UA: NEGATIVE
NITRITE UA: NEGATIVE
PROTEIN UA: NEGATIVE
Spec Grav, UA: >=1.03 — AB (ref 1.010–1.025)
Urobilinogen, UA: 0.2 E.U./dL
pH, UA: 6 (ref 5.0–8.0)

## 2017-09-01 NOTE — Patient Instructions (Signed)
Preventive Care 40-64 Years, Female Preventive care refers to lifestyle choices and visits with your health care provider that can promote health and wellness. What does preventive care include?  A yearly physical exam. This is also called an annual well check.  Dental exams once or twice a year.  Routine eye exams. Ask your health care provider how often you should have your eyes checked.  Personal lifestyle choices, including: ? Daily care of your teeth and gums. ? Regular physical activity. ? Eating a healthy diet. ? Avoiding tobacco and drug use. ? Limiting alcohol use. ? Practicing safe sex. ? Taking low-dose aspirin daily starting at age 55. ? Taking vitamin and mineral supplements as recommended by your health care provider. What happens during an annual well check? The services and screenings done by your health care provider during your annual well check will depend on your age, overall health, lifestyle risk factors, and family history of disease. Counseling Your health care provider may ask you questions about your:  Alcohol use.  Tobacco use.  Drug use.  Emotional well-being.  Home and relationship well-being.  Sexual activity.  Eating habits.  Work and work Statistician.  Method of birth control.  Menstrual cycle.  Pregnancy history.  Screening You may have the following tests or measurements:  Height, weight, and BMI.  Blood pressure.  Lipid and cholesterol levels. These may be checked every 5 years, or more frequently if you are over 55 years old.  Skin check.  Lung cancer screening. You may have this screening every year starting at age 55 if you have a 30-pack-year history of smoking and currently smoke or have quit within the past 15 years.  Fecal occult blood test (FOBT) of the stool. You may have this test every year starting at age 55.  Flexible sigmoidoscopy or colonoscopy. You may have a sigmoidoscopy every 5 years or a colonoscopy  every 10 years starting at age 55.  Hepatitis C blood test.  Hepatitis B blood test.  Sexually transmitted disease (STD) testing.  Diabetes screening. This is done by checking your blood sugar (glucose) after you have not eaten for a while (fasting). You may have this done every 1-3 years.  Mammogram. This may be done every 1-2 years. Talk to your health care provider about when you should start having regular mammograms. This may depend on whether you have a family history of breast cancer.  BRCA-related cancer screening. This may be done if you have a family history of breast, ovarian, tubal, or peritoneal cancers.  Pelvic exam and Pap test. This may be done every 3 years starting at age 55. Starting at age 36, this may be done every 5 years if you have a Pap test in combination with an HPV test.  Bone density scan. This is done to screen for osteoporosis. You may have this scan if you are at high risk for osteoporosis.  Discuss your test results, treatment options, and if necessary, the need for more tests with your health care provider. Vaccines Your health care provider may recommend certain vaccines, such as:  Influenza vaccine. This is recommended every year.  Tetanus, diphtheria, and acellular pertussis (Tdap, Td) vaccine. You may need a Td booster every 10 years.  Varicella vaccine. You may need this if you have not been vaccinated.  Zoster vaccine. You may need this after age 55.  Measles, mumps, and rubella (MMR) vaccine. You may need at least one dose of MMR if you were born in  [birth year] or later. You may also need a second dose.  Pneumococcal 13-valent conjugate (PCV13) vaccine. You may need this if you have certain conditions and were not previously vaccinated.  Pneumococcal polysaccharide (PPSV23) vaccine. You may need one or two doses if you smoke cigarettes or if you have certain conditions.  Meningococcal vaccine. You may need this if you have certain  conditions.  Hepatitis A vaccine. You may need this if you have certain conditions or if you travel or work in places where you may be exposed to hepatitis A.  Hepatitis B vaccine. You may need this if you have certain conditions or if you travel or work in places where you may be exposed to hepatitis B.  Haemophilus influenzae type b (Hib) vaccine. You may need this if you have certain conditions.  Talk to your health care provider about which screenings and vaccines you need and how often you need them. This information is not intended to replace advice given to you by your health care provider. Make sure you discuss any questions you have with your health care provider. Document Released: 08/24/2015 Document Revised: 04/16/2016 Document Reviewed: 05/29/2015 Elsevier Interactive Patient Education  [employer].

## 2017-09-01 NOTE — Progress Notes (Signed)
Subjective:     Susan Lamb is a 57 y.o. female and is here for a comprehensive physical exam. The patient reports no problems.  Social History   Socioeconomic History  . Marital status: Single    Spouse name: Not on file  . Number of children: Not on file  . Years of education: Not on file  . Highest education level: Not on file  Social Needs  . Financial resource strain: Not on file  . Food insecurity - worry: Not on file  . Food insecurity - inability: Not on file  . Transportation needs - medical: Not on file  . Transportation needs - non-medical: Not on file  Occupational History  . Not on file  Tobacco Use  . Smoking status: Never Smoker  . Smokeless tobacco: Never Used  Substance and Sexual Activity  . Alcohol use: Yes    Comment: 1 or 2 socially  . Drug use: No  . Sexual activity: Yes    Birth control/protection: Surgical    Comment: hysterectomy  Other Topics Concern  . Not on file  Social History Narrative  . Not on file   Health Maintenance  Topic Date Due  . Hepatitis C Screening  [birth year]  . HIV Screening  03/18/1978  . TETANUS/TDAP  02/21/2013  . COLONOSCOPY  03/18/2013  . MAMMOGRAM  05/12/2016  . INFLUENZA VACCINE  03/11/2017    The following portions of the patient's history were reviewed and updated as appropriate:  She  has a past medical history of Back pain, BV (bacterial vaginosis), Contact lens/glasses fitting, Endometrial polyp, Fatigue, Fibroids, Hemorrhoids, Herpes, Menorrhagia, and Ovarian cyst. She does not have any pertinent problems on file. She  has a past surgical history that includes Abdominal hysterectomy (2000); Dilation and curettage of uterus (1998); Laparoscopic gastric banding (03 20 2012); and Myomectomy. Her family history includes Heart disease in her mother; Hypertension in her father; Stroke in her mother. She  reports that  has never smoked. she has never used smokeless tobacco. She reports that she drinks alcohol.  She reports that she does not use drugs. She has a current medication list which includes the following prescription(s): biotin, calcium citrate-vitamin d, cholecalciferol, flaxseed (linseed), flaxseed-eve prim-bilberry, lysine, lysine, multiple vitamins-minerals, prednisone, valacyclovir, and vitamin b-12. Current Outpatient Medications on File Prior to Visit  Medication Sig Dispense Refill  . Biotin 1 MG CAPS biotin    . Calcium Carbonate-Vitamin D (CALCIUM + D PO) Take by mouth.      . cholecalciferol (VITAMIN D) 1000 UNITS tablet Take 1,000 Units by mouth daily.    . Flaxseed, Linseed, (FLAX SEEDS PO) Take 1 tablet by mouth daily.    Randell Loop Prim-Bilberry 1000-500-40 MG CAPS flaxseed    . Lysine 1000 MG TABS lysine  take 2,000 po qd    . LYSINE PO Take by mouth.      . Multiple Vitamins-Minerals (ONE-A-DAY VITACRAVES PO) Take by mouth.      . predniSONE (DELTASONE) 10 MG tablet 3 po qd for 3 days then 2 po qd for 3 days the 1 po qd for 3 days 18 tablet 0  . valACYclovir (VALTREX) 500 MG tablet Take 500 mg by mouth as needed.     . vitamin B-12 (CYANOCOBALAMIN) 1000 MCG tablet Take 1,000 mcg by mouth daily.     No current facility-administered medications on file prior to visit.    She has No Known Allergies..  Review of Systems Review of Systems  Constitutional: Negative for activity change, appetite change and fatigue.  HENT: Negative for hearing loss, congestion, tinnitus and ear discharge.  dentist q7m Eyes: Negative for visual disturbance (see optho q1y -- vision corrected to 20/20 with glasses).  Respiratory: Negative for cough, chest tightness and shortness of breath.   Cardiovascular: Negative for chest pain, palpitations and leg swelling.  Gastrointestinal: Negative for abdominal pain, diarrhea, constipation and abdominal distention.  Genitourinary: Negative for urgency, frequency, decreased urine volume and difficulty urinating.  Musculoskeletal: Negative for back  pain, arthralgias and gait problem.  Skin: Negative for color change, pallor and rash.  Neurological: Negative for dizziness, light-headedness, numbness and headaches.  Hematological: Negative for adenopathy. Does not bruise/bleed easily.  Psychiatric/Behavioral: Negative for suicidal ideas, confusion, sleep disturbance, self-injury, dysphoric mood, decreased concentration and agitation.       Objective:    BP (!) 144/87 (BP Location: Left Arm, Patient Position: Sitting, Cuff Size: Large)   Pulse 73   Temp 98.1 F (36.7 C) (Oral)   Resp 16   Ht 5\' 3"  (1.6 m)   Wt 221 lb 9.6 oz (100.5 kg)   SpO2 100%   BMI 39.25 kg/m  General appearance: alert, cooperative, appears stated age and no distress Head: Normocephalic, without obvious abnormality, atraumatic Eyes: conjunctivae/corneas clear. PERRL, EOM's intact. Fundi benign. Ears: normal TM's and external ear canals both ears Nose: Nares normal. Septum midline. Mucosa normal. No drainage or sinus tenderness. Throat: lips, mucosa, and tongue normal; teeth and gums normal Neck: no adenopathy, no carotid bruit, no JVD, supple, symmetrical, trachea midline and thyroid not enlarged, symmetric, no tenderness/mass/nodules Back: symmetric, no curvature. ROM normal. No CVA tenderness. Lungs: clear to auscultation bilaterally Breasts: gyn Heart: S1, S2 normal Abdomen: soft, non-tender; bowel sounds normal; no masses,  no organomegaly Pelvic: deferred--gyn Extremities: extremities normal, atraumatic, no cyanosis or edema Pulses: 2+ and symmetric Skin: Skin color, texture, turgor normal. No rashes or lesions Lymph nodes: Cervical, supraclavicular, and axillary nodes normal. Neurologic: Alert and oriented X 3, normal strength and tone. Normal symmetric reflexes. Normal coordination and gait    Assessment:    Healthy female exam.      Plan:    ghm utd Check labs See After Visit Summary for Counseling Recommendations    1. Preventative  health care See above tdap and flu given today She will get records from GI in [place] -- colonoscopy Mammogram done with DR Haygood - Lipid panel - CBC with Differential/Platelet - TSH - Comprehensive metabolic panel - POCT Urinalysis Dipstick (Automated)

## 2017-09-02 LAB — CBC WITH DIFFERENTIAL/PLATELET
BASOS PCT: 0.8 % (ref 0.0–3.0)
Basophils Absolute: 0.1 10*3/uL (ref 0.0–0.1)
Eosinophils Absolute: 0 10*3/uL (ref 0.0–0.7)
Eosinophils Relative: 0.6 % (ref 0.0–5.0)
HEMATOCRIT: 38.3 % (ref 36.0–46.0)
Hemoglobin: 12.8 g/dL (ref 12.0–15.0)
LYMPHS ABS: 2.9 10*3/uL (ref 0.7–4.0)
LYMPHS PCT: 38.3 % (ref 12.0–46.0)
MCHC: 33.3 g/dL (ref 30.0–36.0)
MCV: 93.4 fl (ref 78.0–100.0)
Monocytes Absolute: 0.5 10*3/uL (ref 0.1–1.0)
Monocytes Relative: 6.9 % (ref 3.0–12.0)
NEUTROS PCT: 53.4 % (ref 43.0–77.0)
Neutro Abs: 4 10*3/uL (ref 1.4–7.7)
Platelets: 357 10*3/uL (ref 150.0–400.0)
RBC: 4.1 Mil/uL (ref 3.87–5.11)
RDW: 13.9 % (ref 11.5–15.5)
WBC: 7.5 10*3/uL (ref 4.0–10.5)

## 2017-09-02 LAB — LIPID PANEL
CHOL/HDL RATIO: 3
CHOLESTEROL: 225 mg/dL — AB (ref 0–200)
HDL: 64.5 mg/dL (ref >39.00)
LDL Cholesterol: 149 mg/dL — ABNORMAL HIGH (ref 0–99)
NonHDL: 160.52
TRIGLYCERIDES: 60 mg/dL (ref 0.0–149.0)
VLDL: 12 mg/dL (ref 0.0–40.0)

## 2017-09-02 LAB — COMPREHENSIVE METABOLIC PANEL
ALBUMIN: 3.9 g/dL (ref 3.5–5.2)
ALT: 10 U/L (ref 0–35)
AST: 17 U/L (ref 0–37)
Alkaline Phosphatase: 84 U/L (ref 39–117)
BUN: 10 mg/dL (ref 6–23)
CALCIUM: 9.3 mg/dL (ref 8.4–10.5)
CO2: 26 meq/L (ref 19–32)
Chloride: 102 mEq/L (ref 96–112)
Creatinine, Ser: 0.72 mg/dL (ref 0.40–1.20)
GFR: 108.37 mL/min (ref >60.00)
Glucose, Bld: 79 mg/dL (ref 70–99)
POTASSIUM: 4.2 meq/L (ref 3.5–5.1)
Sodium: 139 mEq/L (ref 135–145)
Total Bilirubin: 0.5 mg/dL (ref 0.2–1.2)
Total Protein: 8 g/dL (ref 6.0–8.3)

## 2017-09-02 LAB — TSH: TSH: 1.8 u[IU]/mL (ref 0.35–4.50)

## 2017-09-08 ENCOUNTER — Other Ambulatory Visit: Payer: Self-pay

## 2017-09-08 DIAGNOSIS — E7800 Pure hypercholesterolemia, unspecified: Secondary | ICD-10-CM

## 2017-12-18 ENCOUNTER — Other Ambulatory Visit (HOSPITAL_COMMUNITY): Payer: Self-pay | Admitting: Surgery

## 2017-12-18 DIAGNOSIS — Z4651 Encounter for fitting and adjustment of gastric lap band: Secondary | ICD-10-CM

## 2017-12-24 ENCOUNTER — Ambulatory Visit (HOSPITAL_COMMUNITY): Payer: Managed Care, Other (non HMO)

## 2017-12-30 ENCOUNTER — Ambulatory Visit (HOSPITAL_COMMUNITY)
Admission: RE | Admit: 2017-12-30 | Discharge: 2017-12-30 | Disposition: A | Discharge status: Home / Self Care | Payer: Managed Care, Other (non HMO) | Source: Ambulatory Visit | Attending: Surgery | Admitting: Surgery

## 2017-12-30 DIAGNOSIS — K219 Gastro-esophageal reflux disease without esophagitis: Secondary | ICD-10-CM | POA: Diagnosis not present

## 2017-12-30 DIAGNOSIS — Z4651 Encounter for fitting and adjustment of gastric lap band: Secondary | ICD-10-CM | POA: Diagnosis not present

## 2017-12-30 DIAGNOSIS — K224 Dyskinesia of esophagus: Secondary | ICD-10-CM | POA: Insufficient documentation

## 2018-02-05 ENCOUNTER — Ambulatory Visit (INDEPENDENT_AMBULATORY_CARE_PROVIDER_SITE_OTHER): Payer: Managed Care, Other (non HMO) | Admitting: Family Medicine

## 2018-02-05 VITALS — BP 146/77 | HR 77 | Temp 98.2°F | Resp 16 | Ht 63.0 in | Wt 223.0 lb

## 2018-02-05 DIAGNOSIS — K219 Gastro-esophageal reflux disease without esophagitis: Secondary | ICD-10-CM | POA: Diagnosis not present

## 2018-02-05 DIAGNOSIS — E7800 Pure hypercholesterolemia, unspecified: Secondary | ICD-10-CM | POA: Diagnosis not present

## 2018-02-05 DIAGNOSIS — R030 Elevated blood-pressure reading, without diagnosis of hypertension: Secondary | ICD-10-CM

## 2018-02-05 LAB — COMPREHENSIVE METABOLIC PANEL
ALK PHOS: 92 U/L (ref 39–117)
ALT: 11 U/L (ref 0–35)
AST: 15 U/L (ref 0–37)
Albumin: 4.1 g/dL (ref 3.5–5.2)
BILIRUBIN TOTAL: 0.4 mg/dL (ref 0.2–1.2)
BUN: 13 mg/dL (ref 6–23)
CO2: 30 mEq/L (ref 19–32)
Calcium: 9.5 mg/dL (ref 8.4–10.5)
Chloride: 101 mEq/L (ref 96–112)
Creatinine, Ser: 0.77 mg/dL (ref 0.40–1.20)
GFR: 100.13 mL/min (ref >60.00)
GLUCOSE: 107 mg/dL — AB (ref 70–99)
Potassium: 4.6 mEq/L (ref 3.5–5.1)
Sodium: 138 mEq/L (ref 135–145)
Total Protein: 7.7 g/dL (ref 6.0–8.3)

## 2018-02-05 LAB — LIPID PANEL
CHOLESTEROL: 229 mg/dL — AB (ref 0–200)
HDL: 60.2 mg/dL (ref >39.00)
LDL Cholesterol: 157 mg/dL — ABNORMAL HIGH (ref 0–99)
NONHDL: 168.78
Total CHOL/HDL Ratio: 4
Triglycerides: 61 mg/dL (ref 0.0–149.0)
VLDL: 12.2 mg/dL (ref 0.0–40.0)

## 2018-02-05 MED ORDER — PANTOPRAZOLE SODIUM 40 MG PO TBEC: 40.0000 mg | DELAYED_RELEASE_TABLET | Freq: Every day | ORAL | 3 refills | Status: DC

## 2018-02-05 NOTE — Patient Instructions (Signed)
Food Choices for Gastroesophageal Reflux Disease, Adult When you have gastroesophageal reflux disease (GERD), the foods you eat and your eating habits are very important. Choosing the right foods can help ease your discomfort. What guidelines do I need to follow?  Choose fruits, vegetables, whole grains, and low-fat dairy products.  Choose low-fat meat, fish, and poultry.  Limit fats such as oils, salad dressings, butter, nuts, and avocado.  Keep a food diary. This helps you identify foods that cause symptoms.  Avoid foods that cause symptoms. These may be different for everyone.  Eat small meals often instead of 3 large meals a day.  Eat your meals slowly, in a place where you are relaxed.  Limit fried foods.  Cook foods using methods other than frying.  Avoid drinking alcohol.  Avoid drinking large amounts of liquids with your meals.  Avoid bending over or lying down until 2-3 hours after eating. What foods are not recommended? These are some foods and drinks that may make your symptoms worse: Vegetables  Tomatoes. Tomato juice. Tomato and spaghetti sauce. Chili peppers. Onion and garlic. Horseradish. Fruits  Oranges, grapefruit, and lemon (fruit and juice). Meats  High-fat meats, fish, and poultry. This includes hot dogs, ribs, ham, sausage, salami, and bacon. Dairy  Whole milk and chocolate milk. Sour cream. Cream. Butter. Ice cream. Cream cheese. Drinks  Coffee and tea. Bubbly (carbonated) drinks or energy drinks. Condiments  Hot sauce. Barbecue sauce. Sweets/Desserts  Chocolate and cocoa. Donuts. Peppermint and spearmint. Fats and Oils  High-fat foods. This includes French fries and potato chips. Other  Vinegar. Strong spices. This includes black pepper, white pepper, red pepper, cayenne, curry powder, cloves, ginger, and chili powder. The items listed above may not be a complete list of foods and drinks to avoid. Contact your dietitian for more information.    This information is not intended to replace advice given to you by your health care provider. Make sure you discuss any questions you have with your health care provider. Document Released: 01/27/2012 Document Revised: 01/03/2016 Document Reviewed: 06/01/2013 Elsevier Interactive Patient Education  2017 Elsevier Inc.  

## 2018-02-05 NOTE — Progress Notes (Signed)
Patient ID: Susan Lamb, female    DOB: [birth year]  Age: 57 y.o. MRN: [id number]    Subjective:  Subjective  HPI  Susan Lamb presents for f/u UGI--- + gerd.  She is taking nexium otc and pepcid with no relief.  This week she has woken up with sore throat and constantly needing to clear throat.   Review of Systems  Constitutional: Negative for appetite change, diaphoresis, fatigue and unexpected weight change.  HENT: Positive for sore throat.   Eyes: Negative for pain, redness and visual disturbance.  Respiratory: Negative for cough, chest tightness, shortness of breath and wheezing.   Cardiovascular: Negative for chest pain, palpitations and leg swelling.  Gastrointestinal: Negative for abdominal distention, nausea and rectal pain.  Endocrine: Negative for cold intolerance, heat intolerance, polydipsia, polyphagia and polyuria.  Genitourinary: Negative for difficulty urinating, dysuria and frequency.  Neurological: Negative for dizziness, light-headedness, numbness and headaches.    History Past Medical History:  Diagnosis Date  . Back pain   . BV (bacterial vaginosis)   . Contact lens/glasses fitting   . Endometrial polyp   . Fatigue   . Fibroids   . Hemorrhoids   . Herpes   . Menorrhagia   . Ovarian cyst     She has a past surgical history that includes Abdominal hysterectomy (2000); Dilation and curettage of uterus (1998); Laparoscopic gastric banding (03 20 2012); and Myomectomy.   Her family history includes Heart disease in her mother; Hypertension in her father; Stroke in her mother.She reports that she has never smoked. She has never used smokeless tobacco. She reports that she drinks alcohol. She reports that she does not use drugs.  Current Outpatient Medications on File Prior to Visit  Medication Sig Dispense Refill  . Biotin 1 MG CAPS biotin    . Calcium Carbonate-Vitamin D (CALCIUM + D PO) Take by mouth.      . cholecalciferol (VITAMIN D) 1000 UNITS  tablet Take 1,000 Units by mouth daily.    . famotidine (PEPCID) 20 MG tablet Take 20 mg by mouth 2 (two) times daily.    . Flaxseed, Linseed, (FLAX SEEDS PO) Take 1 tablet by mouth daily.    Randell Loop Prim-Bilberry 1000-500-40 MG CAPS flaxseed    . Lysine 1000 MG TABS lysine  take 2,000 po qd    . Multiple Vitamins-Minerals (ONE-A-DAY VITACRAVES PO) Take by mouth.      . valACYclovir (VALTREX) 500 MG tablet Take 500 mg by mouth as needed.      No current facility-administered medications on file prior to visit.      Objective:  Objective  Physical Exam  Constitutional: She is oriented to person, place, and time. She appears well-developed and well-nourished.  HENT:  Head: Normocephalic and atraumatic.  Mouth/Throat: Oropharynx is clear and moist. No oropharyngeal exudate, posterior oropharyngeal edema or posterior oropharyngeal erythema.  Eyes: Conjunctivae and EOM are normal.  Neck: Normal range of motion. Neck supple. No JVD present. Carotid bruit is not present. No thyromegaly present.  Cardiovascular: Normal rate, regular rhythm and normal heart sounds.  No murmur heard. Pulmonary/Chest: Effort normal and breath sounds normal. No respiratory distress. She has no wheezes. She has no rales. She exhibits no tenderness.  Abdominal: Soft. Bowel sounds are normal.  Musculoskeletal: She exhibits no edema.  Neurological: She is alert and oriented to person, place, and time.  Psychiatric: She has a normal mood and affect.  Nursing note and vitals reviewed.  BP (!) 146/77 (BP  Location: Right Arm, Patient Position: Sitting, Cuff Size: Large)   Pulse 77   Temp 98.2 F (36.8 C) (Oral)   Resp 16   Ht 5\' 3"  (1.6 m)   Wt 223 lb (101.2 kg)   SpO2 97%   BMI 39.50 kg/m  Wt Readings from Last 3 Encounters:  02/05/18 223 lb (101.2 kg)  09/01/17 221 lb 9.6 oz (100.5 kg)  09/12/14 235 lb 5.1 oz (106.7 kg)     Lab Results  Component Value Date   WBC 7.5 09/01/2017   HGB 12.8  09/01/2017   HCT 38.3 09/01/2017   PLT 357.0 09/01/2017   GLUCOSE 107 (H) 02/05/2018   CHOL 229 (H) 02/05/2018   TRIG 61.0 02/05/2018   HDL 60.20 02/05/2018   LDLCALC 157 (H) 02/05/2018   ALT 11 02/05/2018   AST 15 02/05/2018   NA 138 02/05/2018   K 4.6 02/05/2018   CL 101 02/05/2018   CREATININE 0.77 02/05/2018   BUN 13 02/05/2018   CO2 30 02/05/2018   TSH 1.80 09/01/2017    Dg Ugi  W/kub  Result Date: 12/30/2017 CLINICAL DATA:  57 year old female with history of lap band procedure presenting with persistent nighttime reflex. Burning in the throat. Sensation of food getting stuck in the lower esophagus. EXAM: UPPER GI SERIES WITH KUB TECHNIQUE: After obtaining a scout radiograph a routine upper GI series was performed using thin barium. FLUOROSCOPY TIME:  Fluoroscopy Time:  3 minutes and 24 seconds Radiation Exposure Index (if provided by the fluoroscopic device): 60.7 mGy COMPARISON:  None. FINDINGS: Initial KUB demonstrates the presence of the LapBand. The image was slightly oblique, however, the phi angle of the LapBand is approximately 46 degrees (within normal limits). Visualized bowel gas pattern is nonobstructive. Multiple single swallow attempts were observed, which demonstrated intermittent failure to completely propagate a normal primary peristaltic wave. Rare tertiary contractions (mild) were noted. Full column esophagram demonstrated no esophageal mass, stricture or esophageal ring. No hiatal hernia. Significant gastroesophageal reflux was witnessed several times during the examination. Images of the stomach demonstrate the presence of the LapBand with small proximal gastric pouch. The gastric pouch did readily empty into the remainder of the stomach without evidence of obstruction. Double contrast imaging was not performed. Within the limitations of today's examination, no definite gastric mass or ulcer was identified. Duodenal bulb was grossly normal in appearance. IMPRESSION: 1.  Nonspecific esophageal motility disorder with mild tertiary contractions. 2. Study was positive for gastroesophageal reflux. 3. LapBand appears appropriately positioned. Electronically Signed   By: Vinnie Langton M.D.   On: 12/30/2017 09:38     Assessment & Plan:  Plan  I have discontinued Bedie Dominey. Susan Lamb's LYSINE PO, vitamin B-12, predniSONE, and esomeprazole. I am also having her start on pantoprazole. Additionally, I am having her maintain her Multiple Vitamins-Minerals (ONE-A-DAY VITACRAVES PO), Calcium Carbonate-Vitamin D (CALCIUM + D PO), valACYclovir, cholecalciferol, (Flaxseed, Linseed, (FLAX SEEDS PO)), Biotin, Flaxseed-Eve Prim-Bilberry, Lysine, and famotidine.  Meds ordered this encounter  Medications  . pantoprazole (PROTONIX) 40 MG tablet    Sig: Take 1 tablet (40 mg total) by mouth daily.    Dispense:  30 tablet    Refill:  3    Problem List Items Addressed This Visit      Unprioritized   ELEVATED BP READING WITHOUT DX HYPERTENSION    Dash diet  rto 3 months      GERD - Primary    con't meds stable      Relevant  Medications   famotidine (PEPCID) 20 MG tablet   pantoprazole (PROTONIX) 40 MG tablet   Hypercholesterolemia    Check labs Encouraged heart healthy diet, increase exercise, avoid trans fats, consider a krill oil cap daily         Follow-up: Return in about 3 months (around 05/08/2018), or if symptoms worsen or fail to improve.  Ann Held, DO

## 2018-02-06 DIAGNOSIS — E785 Hyperlipidemia, unspecified: Secondary | ICD-10-CM | POA: Insufficient documentation

## 2018-02-06 NOTE — Assessment & Plan Note (Signed)
Dash diet  rto 3 months

## 2018-02-06 NOTE — Assessment & Plan Note (Signed)
con't meds stable 

## 2018-02-06 NOTE — Assessment & Plan Note (Signed)
Check labs  Encouraged heart healthy diet, increase exercise, avoid trans fats, consider a krill oil cap daily 

## 2018-02-08 ENCOUNTER — Other Ambulatory Visit: Payer: Self-pay

## 2018-02-09 ENCOUNTER — Other Ambulatory Visit: Payer: Self-pay | Admitting: *Deleted

## 2018-02-09 DIAGNOSIS — E785 Hyperlipidemia, unspecified: Secondary | ICD-10-CM

## 2018-02-09 MED ORDER — ATORVASTATIN CALCIUM 20 MG PO TABS: 20.0000 mg | ORAL_TABLET | Freq: Every day | ORAL | 2 refills | Status: DC

## 2018-02-09 NOTE — Progress Notes (Signed)
lip

## 2018-05-04 ENCOUNTER — Telehealth: Payer: Self-pay | Admitting: Family Medicine

## 2018-05-04 NOTE — Telephone Encounter (Signed)
Copied from [facility] [phone]. Topic: Quick Communication - Rx Refill/Question >> May 04, 2018  8:08 AM Oliver Pila B wrote: Medication: atorvastatin (LIPITOR) 20 MG tablet [[id number]]   Pt called to inquire about why she was given the medication w/out explanation of other options; she would like to discuss her concerns w/ a nurse

## 2018-05-04 NOTE — Telephone Encounter (Signed)
Notes recorded by Doylene Canning, CMA on 02/09/2018 at 11:37 AM EDT Left message on machine with results and to call back with any questions or concerns. Med sent in, future labs placed. ------  Notes recorded by Carollee Herter, Alferd Apa, DO on 02/06/2018 at 4:33 PM EDT Cholesterol--- LDL goal < 100, HDL >40, TG < 150. Diet and exercise will increase HDL and decrease LDL and TG. Fish, Fish Oil, Flaxseed oil will also help increase the HDL and decrease Triglycerides.  Recheck labs in 3 months Start lipitor 20 mg #30 1 po qhs, 2 refills.

## 2018-05-07 ENCOUNTER — Other Ambulatory Visit: Payer: Self-pay | Admitting: *Deleted

## 2018-05-07 DIAGNOSIS — E785 Hyperlipidemia, unspecified: Secondary | ICD-10-CM

## 2018-05-11 ENCOUNTER — Ambulatory Visit: Payer: Managed Care, Other (non HMO) | Admitting: Family Medicine

## 2018-08-23 ENCOUNTER — Other Ambulatory Visit: Payer: Self-pay

## 2018-08-27 ENCOUNTER — Ambulatory Visit: Payer: Self-pay | Admitting: Family Medicine

## 2018-08-30 ENCOUNTER — Encounter: Payer: Self-pay | Admitting: Family Medicine

## 2018-08-30 ENCOUNTER — Ambulatory Visit (INDEPENDENT_AMBULATORY_CARE_PROVIDER_SITE_OTHER): Payer: Managed Care, Other (non HMO) | Admitting: Family Medicine

## 2018-08-30 VITALS — BP 126/80 | HR 96 | Temp 98.3°F | Resp 16 | Ht 63.0 in | Wt 229.6 lb

## 2018-08-30 DIAGNOSIS — Z1159 Encounter for screening for other viral diseases: Secondary | ICD-10-CM

## 2018-08-30 DIAGNOSIS — E785 Hyperlipidemia, unspecified: Secondary | ICD-10-CM | POA: Diagnosis not present

## 2018-08-30 DIAGNOSIS — R1013 Epigastric pain: Secondary | ICD-10-CM | POA: Diagnosis not present

## 2018-08-30 MED ORDER — DEXLANSOPRAZOLE 60 MG PO CPDR: 60.0000 mg | DELAYED_RELEASE_CAPSULE | Freq: Every day | ORAL | 2 refills | Status: DC

## 2018-08-30 NOTE — Patient Instructions (Signed)

## 2018-08-30 NOTE — Progress Notes (Signed)
Patient ID: Susan Lamb, female    DOB: 28-Apr-1961  Age: 58 y.o. MRN: [id number]    Subjective:  Subjective  HPI Susan Lamb presents for dyspepsia and protonix is not working .   It is worse with caffeine.    Review of Systems  Constitutional: Negative for appetite change, diaphoresis, fatigue and unexpected weight change.  Eyes: Negative for pain, redness and visual disturbance.  Respiratory: Negative for cough, chest tightness, shortness of breath and wheezing.   Cardiovascular: Negative for chest pain, palpitations and leg swelling.  Gastrointestinal: Positive for abdominal pain. Negative for abdominal distention, nausea and vomiting.  Endocrine: Negative for cold intolerance, heat intolerance, polydipsia, polyphagia and polyuria.  Genitourinary: Negative for difficulty urinating, dysuria and frequency.  Neurological: Negative for dizziness, light-headedness, numbness and headaches.    History Past Medical History:  Diagnosis Date  . Back pain   . BV (bacterial vaginosis)   . Contact lens/glasses fitting   . Endometrial polyp   . Fatigue   . Fibroids   . Hemorrhoids   . Herpes   . Menorrhagia   . Ovarian cyst     She has a past surgical history that includes Abdominal hysterectomy (2000); Dilation and curettage of uterus (1998); Laparoscopic gastric banding (03 20 2012); and Myomectomy.   Her family history includes Heart disease in her mother; Hypertension in her father; Stroke in her mother.She reports that she has never smoked. She has never used smokeless tobacco. She reports current alcohol use. She reports that she does not use drugs.  Current Outpatient Medications on File Prior to Visit  Medication Sig Dispense Refill  . Biotin 1 MG CAPS biotin    . Calcium Carbonate-Vitamin D (CALCIUM + D PO) Take by mouth.      . cholecalciferol (VITAMIN D) 1000 UNITS tablet Take 1,000 Units by mouth daily.    . Flaxseed, Linseed, (FLAX SEEDS PO) Take 1 tablet by mouth  daily.    Randell Loop Prim-Bilberry 1000-500-40 MG CAPS flaxseed    . Lysine 1000 MG TABS lysine  take 2,000 po qd    . Multiple Vitamins-Minerals (ONE-A-DAY VITACRAVES PO) Take by mouth.      . valACYclovir (VALTREX) 500 MG tablet Take 500 mg by mouth as needed.      No current facility-administered medications on file prior to visit.      Objective:  Objective  Physical Exam Vitals signs and nursing note reviewed.  Constitutional:      Appearance: She is well-developed.  HENT:     Head: Normocephalic and atraumatic.  Eyes:     Conjunctiva/sclera: Conjunctivae normal.  Neck:     Musculoskeletal: Normal range of motion and neck supple.     Thyroid: No thyromegaly.     Vascular: No carotid bruit or JVD.  Cardiovascular:     Rate and Rhythm: Normal rate and regular rhythm.     Heart sounds: Normal heart sounds. No murmur.  Pulmonary:     Effort: Pulmonary effort is normal. No respiratory distress.     Breath sounds: Normal breath sounds. No wheezing or rales.  Chest:     Chest wall: No tenderness.  Neurological:     Mental Status: She is alert and oriented to person, place, and time.    BP 126/80 (BP Location: Right Arm, Cuff Size: Large)   Pulse 96   Temp 98.3 F (36.8 C) (Oral)   Resp 16   Ht 5\' 3"  (1.6 m)   Wt 229  lb 9.6 oz (104.1 kg)   SpO2 98%   BMI 40.67 kg/m  Wt Readings from Last 3 Encounters:  08/30/18 229 lb 9.6 oz (104.1 kg)  02/05/18 223 lb (101.2 kg)  09/01/17 221 lb 9.6 oz (100.5 kg)     Lab Results  Component Value Date   WBC 7.5 09/01/2017   HGB 12.8 09/01/2017   HCT 38.3 09/01/2017   PLT 357.0 09/01/2017   GLUCOSE 107 (H) 02/05/2018   CHOL 229 (H) 02/05/2018   TRIG 61.0 02/05/2018   HDL 60.20 02/05/2018   LDLCALC 157 (H) 02/05/2018   ALT 11 02/05/2018   AST 15 02/05/2018   NA 138 02/05/2018   K 4.6 02/05/2018   CL 101 02/05/2018   CREATININE 0.77 02/05/2018   BUN 13 02/05/2018   CO2 30 02/05/2018   TSH 1.80 09/01/2017    Dg  Ugi  W/kub  Result Date: 12/30/2017 CLINICAL DATA:  58 year old female with history of lap band procedure presenting with persistent nighttime reflex. Burning in the throat. Sensation of food getting stuck in the lower esophagus. EXAM: UPPER GI SERIES WITH KUB TECHNIQUE: After obtaining a scout radiograph a routine upper GI series was performed using thin barium. FLUOROSCOPY TIME:  Fluoroscopy Time:  3 minutes and 24 seconds Radiation Exposure Index (if provided by the fluoroscopic device): 60.7 mGy COMPARISON:  None. FINDINGS: Initial KUB demonstrates the presence of the LapBand. The image was slightly oblique, however, the phi angle of the LapBand is approximately 46 degrees (within normal limits). Visualized bowel gas pattern is nonobstructive. Multiple single swallow attempts were observed, which demonstrated intermittent failure to completely propagate a normal primary peristaltic wave. Rare tertiary contractions (mild) were noted. Full column esophagram demonstrated no esophageal mass, stricture or esophageal ring. No hiatal hernia. Significant gastroesophageal reflux was witnessed several times during the examination. Images of the stomach demonstrate the presence of the LapBand with small proximal gastric pouch. The gastric pouch did readily empty into the remainder of the stomach without evidence of obstruction. Double contrast imaging was not performed. Within the limitations of today's examination, no definite gastric mass or ulcer was identified. Duodenal bulb was grossly normal in appearance. IMPRESSION: 1. Nonspecific esophageal motility disorder with mild tertiary contractions. 2. Study was positive for gastroesophageal reflux. 3. LapBand appears appropriately positioned. Electronically Signed   By: Vinnie Langton M.D.   On: 12/30/2017 09:38     Assessment & Plan:  Plan  I have discontinued Susan Lamb. Susan Lamb's famotidine, pantoprazole, and atorvastatin. I am also having her start on  dexlansoprazole. Additionally, I am having her maintain her Multiple Vitamins-Minerals (ONE-A-DAY VITACRAVES PO), Calcium Carbonate-Vitamin D (CALCIUM + D PO), valACYclovir, cholecalciferol, (Flaxseed, Linseed, (FLAX SEEDS PO)), Biotin, Flaxseed-Eve Prim-Bilberry, and Lysine.  Meds ordered this encounter  Medications  . dexlansoprazole (DEXILANT) 60 MG capsule    Sig: Take 1 capsule (60 mg total) by mouth daily.    Dispense:  30 capsule    Refill:  2    Problem List Items Addressed This Visit      Unprioritized   Dyspepsia - Primary    Pt will f/u with surgery -- s/p lap band  Change protonix to dexilant        Relevant Medications   dexlansoprazole (DEXILANT) 60 MG capsule   Hyperlipidemia    Encouraged heart healthy diet, increase exercise, avoid trans fats, consider a krill oil cap daily      Relevant Orders   Lipid panel   Comprehensive metabolic panel  Other Visit Diagnoses    Need for hepatitis C screening test       Relevant Orders   Hepatitis C antibody    ------------------                                                               Follow-up: Return in about 3 months (around 11/29/2018), or if symptoms worsen or fail to improve, for hyperlipidemia.  Ann Held, DO

## 2018-08-30 NOTE — Assessment & Plan Note (Signed)
Pt will f/u with surgery -- s/p lap band  Change protonix to dexilant

## 2018-08-30 NOTE — Assessment & Plan Note (Signed)
Encouraged heart healthy diet, increase exercise, avoid trans fats, consider a krill oil cap daily 

## 2018-08-31 LAB — LIPID PANEL
Cholesterol: 191 mg/dL (ref 0–200)
HDL: 54.2 mg/dL (ref >39.00)
LDL Cholesterol: 122 mg/dL — ABNORMAL HIGH (ref 0–99)
NonHDL: 137.04
Total CHOL/HDL Ratio: 4
Triglycerides: 74 mg/dL (ref 0.0–149.0)
VLDL: 14.8 mg/dL (ref 0.0–40.0)

## 2018-08-31 LAB — HEPATITIS C ANTIBODY
Hepatitis C Ab: NONREACTIVE
SIGNAL TO CUT-OFF: 0.05 (ref <1.00)

## 2018-08-31 LAB — COMPREHENSIVE METABOLIC PANEL
ALK PHOS: 87 U/L (ref 39–117)
ALT: 11 U/L (ref 0–35)
AST: 19 U/L (ref 0–37)
Albumin: 3.9 g/dL (ref 3.5–5.2)
BILIRUBIN TOTAL: 0.4 mg/dL (ref 0.2–1.2)
BUN: 16 mg/dL (ref 6–23)
CO2: 29 mEq/L (ref 19–32)
CREATININE: 0.81 mg/dL (ref 0.40–1.20)
Calcium: 9.2 mg/dL (ref 8.4–10.5)
Chloride: 104 mEq/L (ref 96–112)
GFR: 88.68 mL/min (ref >60.00)
GLUCOSE: 86 mg/dL (ref 70–99)
Potassium: 4.5 mEq/L (ref 3.5–5.1)
Sodium: 139 mEq/L (ref 135–145)
TOTAL PROTEIN: 7.3 g/dL (ref 6.0–8.3)

## 2018-09-02 ENCOUNTER — Other Ambulatory Visit: Payer: Self-pay | Admitting: *Deleted

## 2018-09-02 DIAGNOSIS — E785 Hyperlipidemia, unspecified: Secondary | ICD-10-CM

## 2018-11-04 ENCOUNTER — Encounter: Payer: Self-pay | Admitting: Family Medicine

## 2018-11-05 NOTE — Telephone Encounter (Signed)
This encounter was created in error - please disregard.

## 2018-11-29 ENCOUNTER — Other Ambulatory Visit: Payer: Self-pay

## 2018-12-01 ENCOUNTER — Telehealth: Payer: Self-pay | Admitting: *Deleted

## 2018-12-01 NOTE — Telephone Encounter (Signed)
Copied from [place] [phone]. Topic: Appointment Scheduling - Scheduling Inquiry for Clinic >> Nov 30, 2018  6:01 PM Yvette Rack wrote: Reason for CRM: Pt called to request that her 12/03/18 appt be cancelled.

## 2018-12-03 ENCOUNTER — Ambulatory Visit: Payer: Self-pay | Admitting: Family Medicine

## 2018-12-03 NOTE — Telephone Encounter (Signed)
Patient stated that she will not be coming out due to corona virus to do lab work and no need to have an appointment because it was just to follow up for cholesterol.  She will call back to schedule.

## 2018-12-08 ENCOUNTER — Other Ambulatory Visit: Payer: Self-pay | Admitting: Family Medicine

## 2018-12-08 DIAGNOSIS — E785 Hyperlipidemia, unspecified: Secondary | ICD-10-CM

## 2019-01-08 ENCOUNTER — Other Ambulatory Visit: Payer: Self-pay | Admitting: Family Medicine

## 2019-01-08 DIAGNOSIS — E785 Hyperlipidemia, unspecified: Secondary | ICD-10-CM

## 2019-06-06 ENCOUNTER — Other Ambulatory Visit: Payer: Self-pay | Admitting: Family Medicine

## 2019-06-06 DIAGNOSIS — E785 Hyperlipidemia, unspecified: Secondary | ICD-10-CM

## 2019-06-14 ENCOUNTER — Other Ambulatory Visit: Payer: Self-pay | Admitting: Family Medicine

## 2019-06-14 DIAGNOSIS — E785 Hyperlipidemia, unspecified: Secondary | ICD-10-CM

## 2019-06-15 NOTE — Telephone Encounter (Signed)
Received atorvastatin request from pharmacy. Med was removed in January as "pt preference". Sent mychart message to pt for verification. Also, pt was advised to follow up in 3 months from that visit so pt is past due for a follow up.

## 2019-06-16 ENCOUNTER — Other Ambulatory Visit: Payer: Self-pay | Admitting: Family Medicine

## 2019-06-16 DIAGNOSIS — K219 Gastro-esophageal reflux disease without esophagitis: Secondary | ICD-10-CM

## 2019-06-16 MED ORDER — ATORVASTATIN CALCIUM 20 MG PO TABS: 20.0000 mg | ORAL_TABLET | Freq: Every day | ORAL | 0 refills | Status: DC

## 2019-06-16 MED ORDER — OMEPRAZOLE 20 MG PO CPDR: 20.0000 mg | DELAYED_RELEASE_CAPSULE | Freq: Every day | ORAL | 3 refills | Status: AC

## 2019-06-16 NOTE — Telephone Encounter (Signed)
Dr Carollee Herter -- please see pt's request below for omeprazole and advise.  Also is it possible for Dr Etter Sjogren to call on again omeprazole for my acid reflux? I tried my mom's for a week and it worked

## 2019-06-16 NOTE — Telephone Encounter (Signed)
done

## 2019-06-17 NOTE — Telephone Encounter (Signed)
Sent mychart message to pt. 

## 2019-07-29 ENCOUNTER — Other Ambulatory Visit: Payer: Self-pay | Admitting: Family Medicine

## 2019-07-29 DIAGNOSIS — E785 Hyperlipidemia, unspecified: Secondary | ICD-10-CM

## 2019-07-29 NOTE — Telephone Encounter (Signed)
Last OV 08/30/18 Last refill 06/16/19 #30/0 Next OV not scheduled

## 2019-08-07 IMAGING — RF DG UGI W/ KUB
12 of 17 series · 14 of 24 positions shown · non-contrast
Comparison: None.

CLINICAL DATA: 56-year-old female with history of lap band
procedure presenting with persistent nighttime reflex. Burning in
the throat. Sensation of food getting stuck in the lower esophagus.

EXAM:
UPPER GI SERIES WITH KUB
TECHNIQUE: After obtaining a scout radiograph a routine upper GI series was
performed using thin barium.
FLUOROSCOPY TIME:  Fluoroscopy Time:  3 minutes and 24 seconds
Radiation Exposure Index (if provided by the fluoroscopic device):
60.7 mGy

[Series 1: t abdomen supine · 0.15mm/px · 1 of 1 slices shown]
[im 1/1]
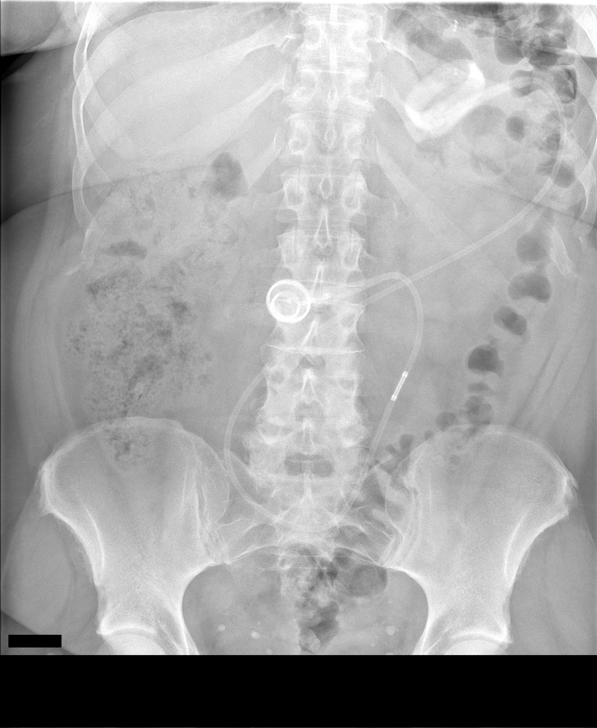

[Series 4: fluoro_barium singleshot_bw · 0.17mm/px · 1 of 1 slices shown (1 of 5)]
[im 1/1]
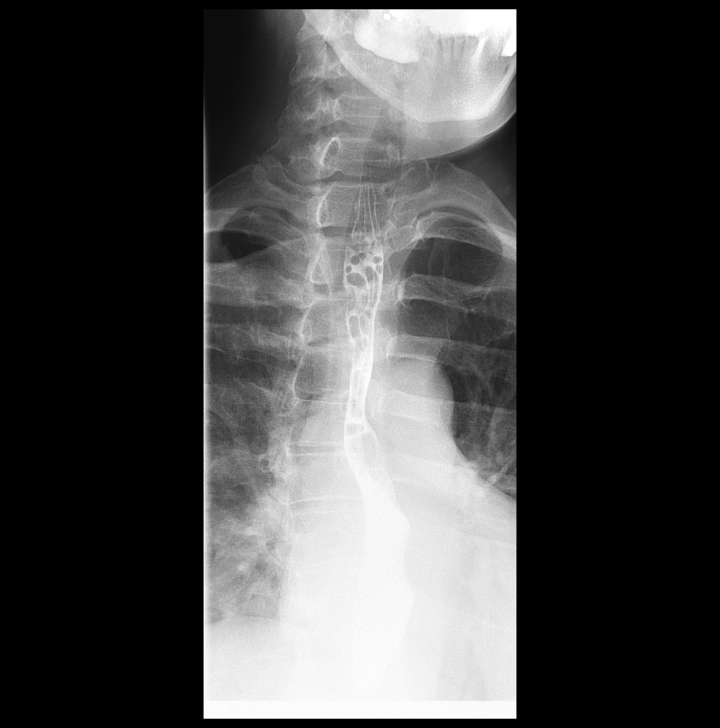

[Series 6: cp_standard · 0.51mm/px · 1 of 22 frames shown (1 of 6)]
[frame 19/22]
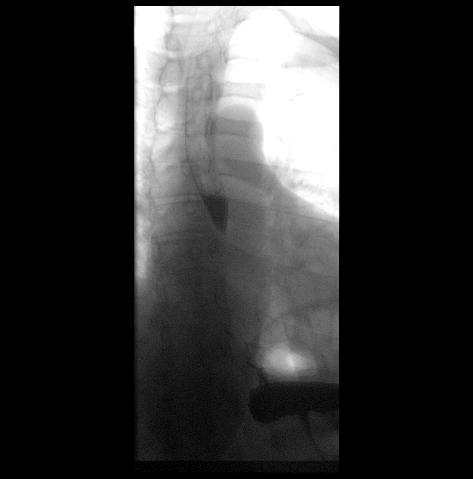

[Series 7: cp_standard · 0.51mm/px · 1 of 54 frames shown (2 of 6)]
[frame 28/54]
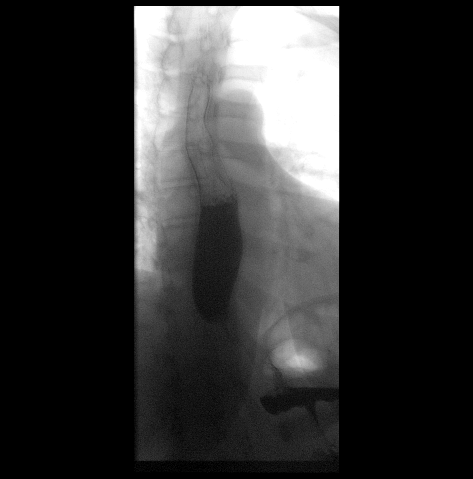

[Series 8: cp_standard · 0.51mm/px · 2 of 71 frames shown (3 of 6)]
[frame 11/71]
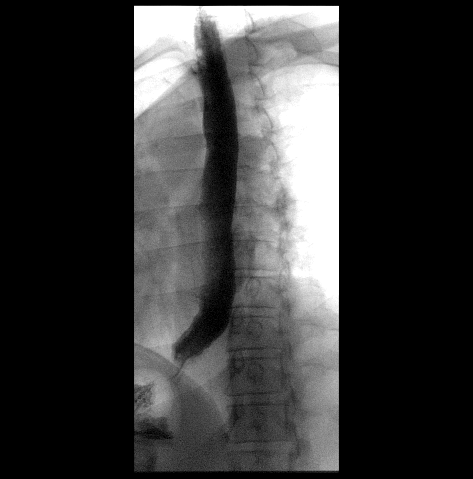
[frame 61/71]
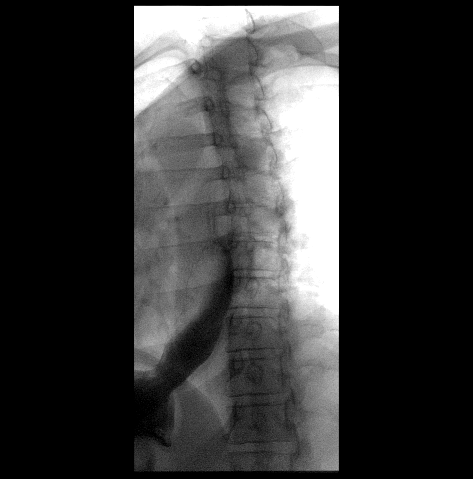

[Series 10: cp_standard · 0.51mm/px · 2 of 47 frames shown (4 of 6)]
[frame 8/47]
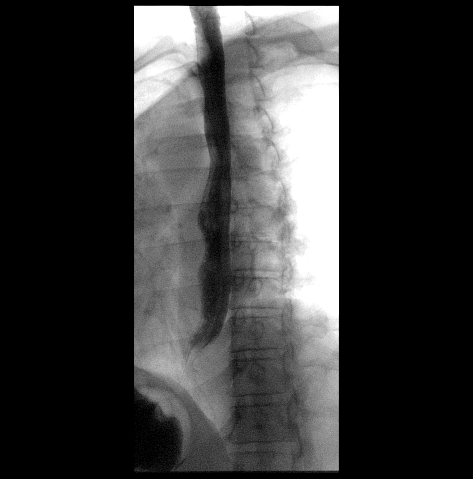
[frame 24/47]
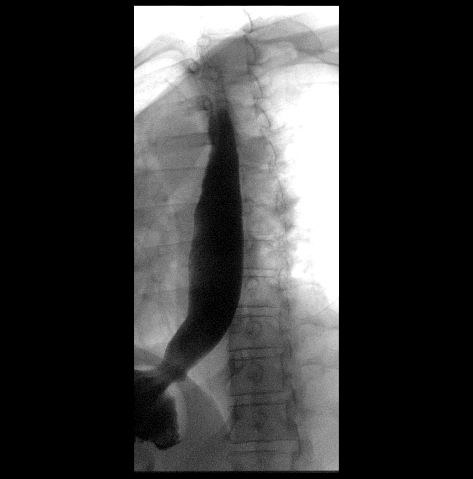

[Series 11: cp_standard · 0.51mm/px · 1 of 98 frames shown (5 of 6)]
[frame 48/98]
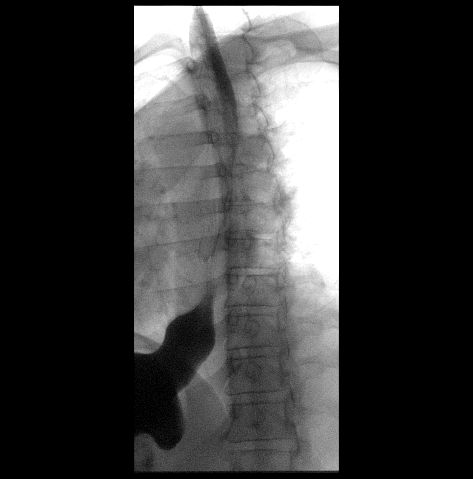

[Series 12: fluoro_barium singleshot_bw · 0.17mm/px · 1 of 1 slices shown (2 of 5)]
[im 1/1]
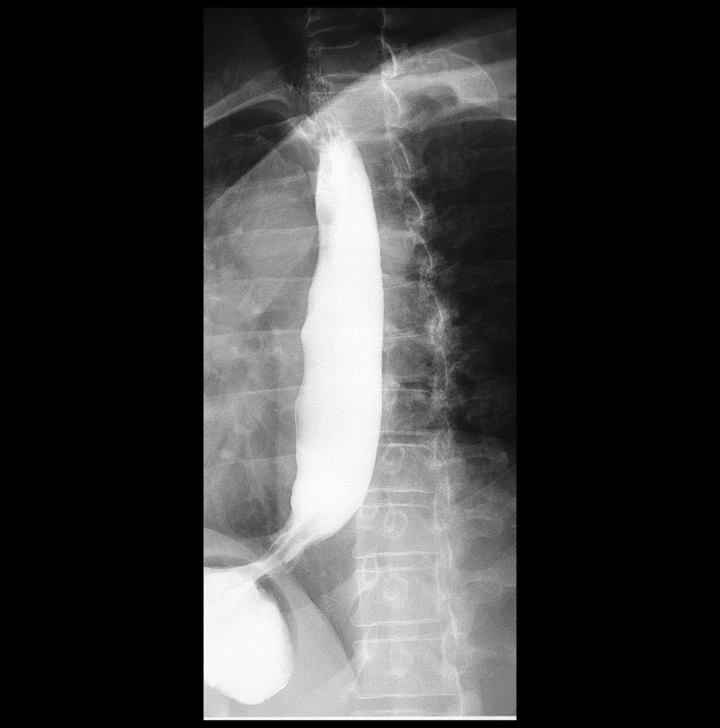

[Series 16: fluoro_barium singleshot_bw · 0.17mm/px · 1 of 1 slices shown (3 of 5)]
[im 1/1]
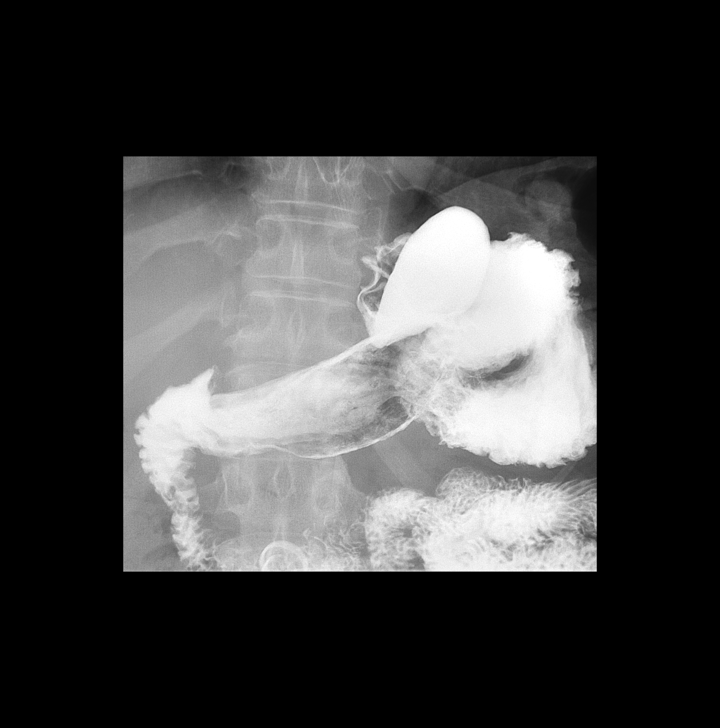

[Series 17: fluoro_barium singleshot_bw · 0.17mm/px · 1 of 1 slices shown (4 of 5)]
[im 1/1]
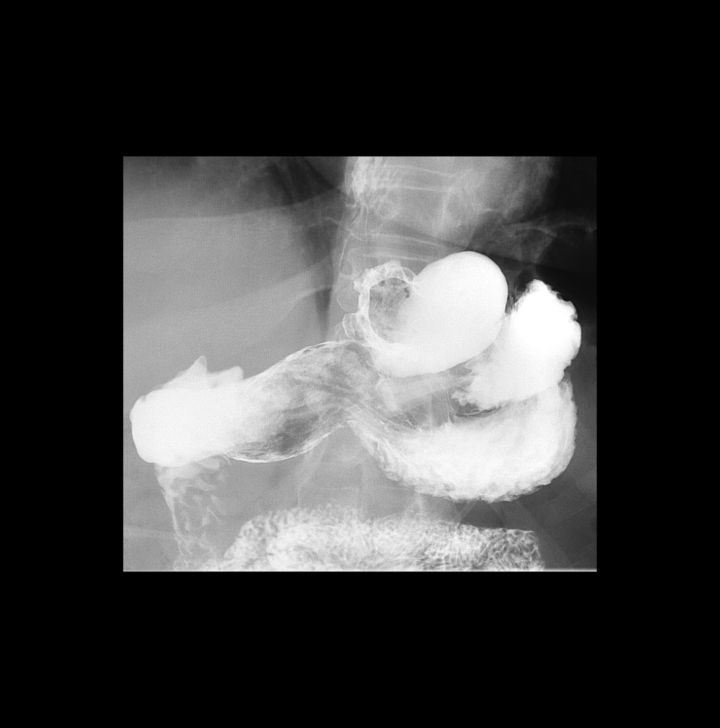

[Series 21: fluoro_barium singleshot_bw · 0.17mm/px · 1 of 1 slices shown (5 of 5)]
[im 1/1]
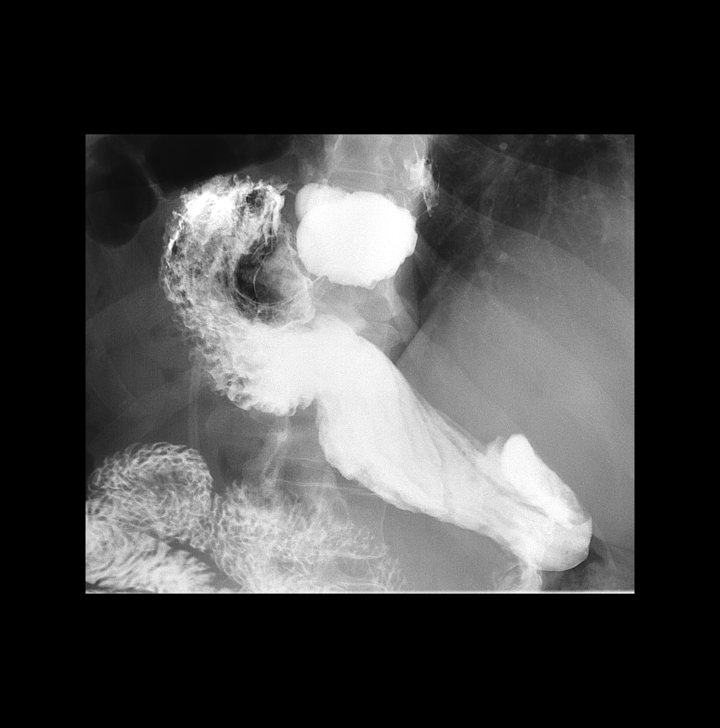

[Series 24: cp_standard · 0.26mm/px · 1 of 1 slices shown (6 of 6)]
[im 1/1]
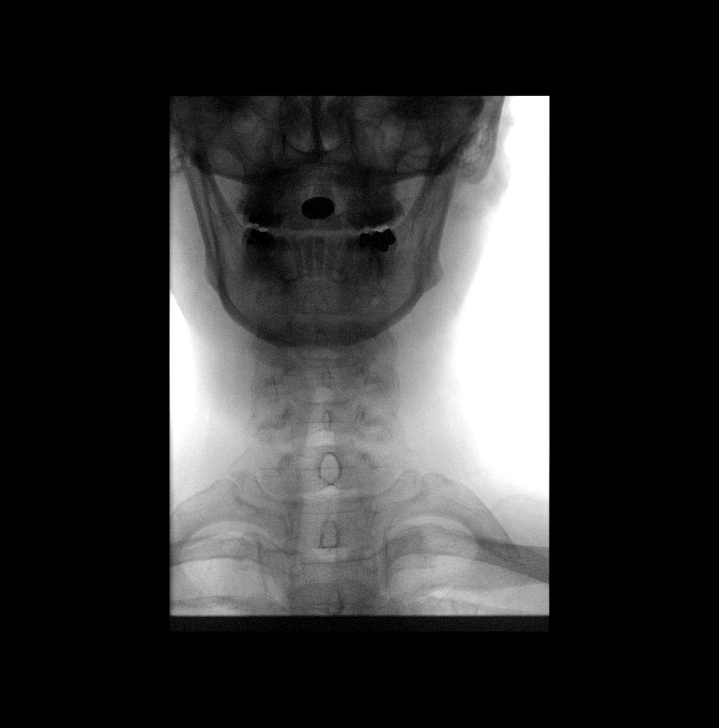

[14 of 24 positions shown; findings below may reference images not displayed]

FINDINGS: Initial KUB demonstrates the presence of the LapBand. The image was
slightly oblique, however, the phi angle of the LapBand is
approximately 46 degrees (within normal limits). Visualized bowel
gas pattern is nonobstructive.

Multiple single swallow attempts were observed, which demonstrated
intermittent failure to completely propagate a normal primary
peristaltic wave. Rare tertiary contractions (mild) were noted. Full
column esophagram demonstrated no esophageal mass, stricture or
esophageal ring. No hiatal hernia. Significant gastroesophageal
reflux was witnessed several times during the examination.

Images of the stomach demonstrate the presence of the LapBand with
small proximal gastric pouch. The gastric pouch did readily empty
into the remainder of the stomach without evidence of obstruction.
Double contrast imaging was not performed. Within the limitations of
today's examination, no definite gastric mass or ulcer was
identified. Duodenal bulb was grossly normal in appearance.
IMPRESSION: 1. Nonspecific esophageal motility disorder with mild tertiary
contractions.
2. Study was positive for gastroesophageal reflux.
3. LapBand appears appropriately positioned.

## 2019-08-08 ENCOUNTER — Encounter: Payer: Self-pay | Admitting: Family Medicine

## 2020-03-02 ENCOUNTER — Other Ambulatory Visit: Payer: Self-pay

## 2020-03-02 ENCOUNTER — Ambulatory Visit (INDEPENDENT_AMBULATORY_CARE_PROVIDER_SITE_OTHER): Payer: Managed Care, Other (non HMO) | Admitting: Family

## 2020-03-02 ENCOUNTER — Encounter: Payer: Self-pay | Admitting: Family

## 2020-03-02 VITALS — BP 137/78 | HR 81 | Temp 99.2°F | Resp 16 | Ht 63.0 in | Wt 237.6 lb

## 2020-03-02 DIAGNOSIS — E785 Hyperlipidemia, unspecified: Secondary | ICD-10-CM

## 2020-03-02 DIAGNOSIS — E1169 Type 2 diabetes mellitus with other specified complication: Secondary | ICD-10-CM | POA: Diagnosis not present

## 2020-03-02 DIAGNOSIS — M5416 Radiculopathy, lumbar region: Secondary | ICD-10-CM | POA: Diagnosis not present

## 2020-03-02 LAB — LIPID PANEL
Cholesterol: 211 mg/dL — ABNORMAL HIGH (ref 0–200)
HDL: 62.1 mg/dL (ref >39.00)
LDL Cholesterol: 138 mg/dL — ABNORMAL HIGH (ref 0–99)
NonHDL: 149.04
Total CHOL/HDL Ratio: 3
Triglycerides: 56 mg/dL (ref 0.0–149.0)
VLDL: 11.2 mg/dL (ref 0.0–40.0)

## 2020-03-02 MED ORDER — MELOXICAM 7.5 MG PO TABS: 7.5000 mg | ORAL_TABLET | Freq: Every day | ORAL | 0 refills | Status: DC

## 2020-03-02 NOTE — Progress Notes (Signed)
Subjective:    Patient ID: Susan Lamb, female    DOB: [birth year], 59 y.o.   MRN: [id number]  HPI  Patient is a 59 yr old female who presents today with chief complaint of "tingling sensation in her left leg."  Started training for a 5 K.  Had some sensitivity on the left lateral thigh.  Has tingling in both thighs with standing. Did some stretching yesterday.    Reports + pain in her heels with standing "with my first steps."     Off of statin x 6 months.  Lab Results  Component Value Date   CHOL 191 08/30/2018   HDL 54.20 08/30/2018   LDLCALC 122 (H) 08/30/2018   TRIG 74.0 08/30/2018   CHOLHDL 4 08/30/2018     Review of Systems See HPI  Past Medical History:  Diagnosis Date  . Back pain   . BV (bacterial vaginosis)   . Contact lens/glasses fitting   . Endometrial polyp   . Fatigue   . Fibroids   . Hemorrhoids   . Herpes   . Menorrhagia   . Ovarian cyst      Social History   Socioeconomic History  . Marital status: Single    Spouse name: Not on file  . Number of children: Not on file  . Years of education: Not on file  . Highest education level: Not on file  Occupational History  . Not on file  Tobacco Use  . Smoking status: Never Smoker  . Smokeless tobacco: Never Used  Substance and Sexual Activity  . Alcohol use: Yes    Comment: 1 or 2 socially  . Drug use: No  . Sexual activity: Yes    Birth control/protection: Surgical    Comment: hysterectomy  Other Topics Concern  . Not on file  Social History Narrative  . Not on file   Social Determinants of Health   Financial Resource Strain:   . Difficulty of Paying Living Expenses:   Food Insecurity:   . Worried About Charity fundraiser in the Last Year:   . Arboriculturist in the Last Year:   Transportation Needs:   . Film/video editor (Medical):   Marland Kitchen Lack of Transportation (Non-Medical):   Physical Activity:   . Days of Exercise per Week:   . Minutes of Exercise per Session:    Stress:   . Feeling of Stress :   Social Connections:   . Frequency of Communication with Friends and Family:   . Frequency of Social Gatherings with Friends and Family:   . Attends Religious Services:   . Active Member of Clubs or Organizations:   . Attends Archivist Meetings:   Marland Kitchen Marital Status:   Intimate Partner Violence:   . Fear of Current or Ex-Partner:   . Emotionally Abused:   Marland Kitchen Physically Abused:   . Sexually Abused:     Past Surgical History:  Procedure Laterality Date  . ABDOMINAL HYSTERECTOMY  2000  . DILATION AND CURETTAGE OF UTERUS  1998   due to two miscarriages  . LAPAROSCOPIC GASTRIC BANDING  03 20 2012  . MYOMECTOMY      Family History  Problem Relation Age of Onset  . Stroke Mother   . Heart disease Mother   . Hypertension Father     No Known Allergies  Current Outpatient Medications on File Prior to Visit  Medication Sig Dispense Refill  . Ascorbic Acid (VITAMIN C) 1000 MG tablet  Take 1,000 mg by mouth daily.    . Biotin 1 MG CAPS biotin    . Calcium Carbonate-Vitamin D (CALCIUM + D PO) Take by mouth.      . Flaxseed, Linseed, (FLAX SEEDS PO) Take 1 tablet by mouth daily.    Randell Loop Prim-Bilberry 1000-500-40 MG CAPS flaxseed    . Lysine 1000 MG TABS lysine  take 2,000 po qd    . Multiple Vitamins-Minerals (ONE-A-DAY VITACRAVES PO) Take by mouth.      Marland Kitchen omeprazole (PRILOSEC) 20 MG capsule Take 1 capsule (20 mg total) by mouth daily. 30 capsule 3  . Probiotic Product (PROBIOTIC ADVANCED PO) Take by mouth.    . valACYclovir (VALTREX) 500 MG tablet Take 500 mg by mouth as needed.     Marland Kitchen atorvastatin (LIPITOR) 20 MG tablet Take 1 tablet (20 mg total) by mouth daily. Call to schedule appointment. (Patient not taking: Reported on 03/02/2020) 30 tablet 0   No current facility-administered medications on file prior to visit.    BP (!) 137/78 (BP Location: Right Arm, Patient Position: Sitting, Cuff Size: Large)   Pulse 81   Temp 99.2  F (37.3 C) (Oral)   Resp 16   Ht 5\' 3"  (1.6 m)   Wt (!) 237 lb 9.6 oz (107.8 kg)   SpO2 100%   BMI 42.09 kg/m       Objective:   Physical Exam Constitutional:      Appearance: Normal appearance. She is well-developed.  Neck:     Thyroid: No thyromegaly.  Cardiovascular:     Rate and Rhythm: Normal rate and regular rhythm.     Heart sounds: Normal heart sounds. No murmur heard.   Pulmonary:     Effort: Pulmonary effort is normal. No respiratory distress.     Breath sounds: Normal breath sounds. No wheezing.  Musculoskeletal:        General: No swelling.     Cervical back: Neck supple.     Comments: + tenderness to palpation ath the base of both heels.   Skin:    General: Skin is warm and dry.  Neurological:     Mental Status: She is alert and oriented to person, place, and time.     Motor: No weakness.     Deep Tendon Reflexes:     Reflex Scores:      Patellar reflexes are 2+ on the right side and 2+ on the left side. Psychiatric:        Behavior: Behavior normal.        Thought Content: Thought content normal.        Judgment: Judgment normal.           Assessment & Plan:  Lumbar radiculopathy- new. Mild symptoms. Trial of meloxicam  Plantar fasciitis- advised meloxicam, icing and exercises- pt given Upto[employer] handout on exercises.  This visit occurred during the SARS-CoV-2 public health emergency.  Safety protocols were in place, including screening questions prior to the visit, additional usage of staff PPE, and extensive cleaning of exam room while observing appropriate contact time as indicated for disinfecting solutions.

## 2020-03-02 NOTE — Patient Instructions (Signed)
Please begin meloxicam for your foot and leg pain. Ice feet twice daily and wear good supportive shoes. Call if symptoms worsen or if not improved in 2 weeks.

## 2020-03-04 ENCOUNTER — Encounter: Payer: Self-pay | Admitting: Family

## 2020-03-04 ENCOUNTER — Telehealth: Payer: Self-pay | Admitting: Family

## 2020-03-04 NOTE — Telephone Encounter (Signed)
Please contact patient and let her know that her cholesterol is mildly elevated, however she is not not at high risk for heart attack or stroke in the next 10 years, therefore I think it is okay for her to remain off of statin for now.

## 2020-03-05 NOTE — Telephone Encounter (Signed)
Advised patient of results and provider's comments.

## 2020-07-24 LAB — HM MAMMOGRAPHY

## 2021-02-12 ENCOUNTER — Telehealth: Payer: Self-pay | Admitting: Family Medicine

## 2021-02-12 NOTE — Telephone Encounter (Signed)
Patient would like to toc from Dr. Etter Sjogren to Jodi Mourning. Please advise

## 2021-02-28 ENCOUNTER — Telehealth: Payer: Self-pay

## 2021-02-28 NOTE — Telephone Encounter (Signed)
Nurse Assessment Nurse: Thad Ranger RN, Denise Date/Time (Eastern Time): 02/28/2021 1:31:04 PM Confirm and document reason for call. If symptomatic, describe symptoms. ---BP 182/115 and 175/109 and does not have a hx of HTN. Has slight HA. Does the patient have any new or worsening symptoms? ---Yes Will a triage be completed? ---Yes Related visit to physician within the last 2 weeks? ---No Does the PT have any chronic conditions? (i.e. diabetes, asthma, this includes High risk factors for pregnancy, etc.) ---No Is this a behavioral health or substance abuse call? ---No Guidelines Guideline Title Affirmed Question Affirmed Notes Nurse Date/Time (Eastern Time) Blood Pressure - High Systolic BP >= 470 OR Diastolic >= 962 Carmon, RN, Denise 02/28/2021 1:32:10 PM Disp. Time Eilene Ghazi Time) Disposition Final User 02/28/2021 1:00:40 PM Send To RN Personal Robby Sermon, RN, April 02/28/2021 1:34:07 PM See PCP within 24 Hours Yes Carmon, RN, Yevette Edwards Disagree/Comply Comply Caller Understands Yes PreDisposition Call Doctor PLEASE NOTE: All timestamps contained within this report are represented as [place] Standard Time. CONFIDENTIALTY NOTICE: This fax transmission is intended only for the addressee. It contains information that is legally privileged, confidential or otherwise protected from use or disclosure. If you are not the intended recipient, you are strictly prohibited from reviewing, disclosing, copying using or disseminating any of this information or taking any action in reliance on or regarding this information. If you have received this fax in error, please notify us immediately by telephone so that we can arrange for its return to [place]. Phone: [phone], Toll-Free: [phone], Fax: [phone] Page: 2 of 2 Call Id: [id number] Care Advice Given Per Guideline SEE PCP WITHIN 24 HOURS: * The goal of blood pressure treatment for most people with hypertension is to keep the  blood pressure under 140/90. For people that are 60 years or older, your doctor may instead want to keep the blood pressure under 150/90. HIGH BLOOD PRESSURE: * Untreated high blood pressure may cause damage to your heart, brain, kidneys, and eyes. * Treatment of high blood pressure can reduce the risk of stroke, heart attack, and heart failure. CALL BACK IF: * Chest pain or difficulty breathing occurs * You become worse CARE ADVICE given per High Blood Pressure (Adult) guideline. Referrals REFERRED TO PCP OFFICE  Appt w/ Dr.Paz tomorrow.

## 2021-03-01 ENCOUNTER — Other Ambulatory Visit: Payer: Self-pay

## 2021-03-01 ENCOUNTER — Encounter: Payer: Self-pay | Admitting: Internal Medicine

## 2021-03-01 ENCOUNTER — Ambulatory Visit (INDEPENDENT_AMBULATORY_CARE_PROVIDER_SITE_OTHER): Payer: Managed Care, Other (non HMO) | Admitting: Internal Medicine

## 2021-03-01 VITALS — BP 152/100 | HR 96 | Temp 98.3°F | Resp 18 | Ht 63.0 in | Wt 249.5 lb

## 2021-03-01 DIAGNOSIS — I10 Essential (primary) hypertension: Secondary | ICD-10-CM | POA: Diagnosis not present

## 2021-03-01 DIAGNOSIS — R030 Elevated blood-pressure reading, without diagnosis of hypertension: Secondary | ICD-10-CM | POA: Diagnosis not present

## 2021-03-01 LAB — COMPREHENSIVE METABOLIC PANEL
AG Ratio: 1.2 (calc) (ref 1.0–2.5)
ALT: 16 U/L (ref 6–29)
AST: 19 U/L (ref 10–35)
Albumin: 4.1 g/dL (ref 3.6–5.1)
Alkaline phosphatase (APISO): 90 U/L (ref 37–153)
BUN: 11 mg/dL (ref 7–25)
CO2: 27 mmol/L (ref 20–32)
Calcium: 9.5 mg/dL (ref 8.6–10.4)
Chloride: 103 mmol/L (ref 98–110)
Creat: 0.81 mg/dL (ref 0.50–1.03)
Globulin: 3.4 g/dL (calc) (ref 1.9–3.7)
Glucose, Bld: 83 mg/dL (ref 65–99)
Potassium: 4.3 mmol/L (ref 3.5–5.3)
Sodium: 140 mmol/L (ref 135–146)
Total Bilirubin: 0.4 mg/dL (ref 0.2–1.2)
Total Protein: 7.5 g/dL (ref 6.1–8.1)

## 2021-03-01 LAB — CBC WITH DIFFERENTIAL/PLATELET
Absolute Monocytes: 562 cells/uL (ref 200–950)
Basophils Absolute: 37 cells/uL (ref 0–200)
Basophils Relative: 0.5 %
Eosinophils Absolute: 81 cells/uL (ref 15–500)
Eosinophils Relative: 1.1 %
HCT: 37.4 % (ref 35.0–45.0)
Hemoglobin: 12.6 g/dL (ref 11.7–15.5)
Lymphs Abs: 2775 cells/uL (ref 850–3900)
MCH: 30.4 pg (ref 27.0–33.0)
MCHC: 33.7 g/dL (ref 32.0–36.0)
MCV: 90.1 fL (ref 80.0–100.0)
MPV: 9.6 fL (ref 7.5–12.5)
Monocytes Relative: 7.6 %
Neutro Abs: 3944 cells/uL (ref 1500–7800)
Neutrophils Relative %: 53.3 %
Platelets: 345 10*3/uL (ref 140–400)
RBC: 4.15 10*6/uL (ref 3.80–5.10)
RDW: 13.9 % (ref 11.0–15.0)
Total Lymphocyte: 37.5 %
WBC: 7.4 10*3/uL (ref 3.8–10.8)

## 2021-03-01 LAB — TSH: TSH: 2.26 mIU/L (ref 0.40–4.50)

## 2021-03-01 MED ORDER — LOSARTAN POTASSIUM-HCTZ 50-12.5 MG PO TABS: 1.0000 | ORAL_TABLET | Freq: Every day | ORAL | 0 refills | Status: DC

## 2021-03-01 NOTE — Patient Instructions (Addendum)
Start taking losartan HCT: one tablet every morning  Watch for side effects  Check the  blood pressure daily BP GOAL is between 110/65 and  135/85. If it is consistently higher or lower, let me know  Be sure to restart hydrated, drink plenty of water  Start a gradual exercise program  Watch her salt intake  Avoid any over-the-counter decongestants   GO TO THE LAB : Get the blood work     [place], [place] back for a checkup with your primary doctor in 2-week     DASH Eating Plan [place] stands for Dietary Approaches to Stop Hypertension. The DASH eating plan is a healthy eating plan that has been shown to: Reduce high blood pressure (hypertension). Reduce your risk for type 2 diabetes, heart disease, and stroke. Help with weight loss. What are tips for following this plan? Reading food labels Check food labels for the amount of salt (sodium) per serving. Choose foods with less than 5 percent of the Daily Value of sodium. Generally, foods with less than 300 milligrams (mg) of sodium per serving fit into this eating plan. To find whole grains, look for the word "whole" as the first word in the ingredient list. Shopping Buy products labeled as "low-sodium" or "no salt added." Buy fresh foods. Avoid canned foods and pre-made or frozen meals. Cooking Avoid adding salt when cooking. Use salt-free seasonings or herbs instead of table salt or sea salt. Check with your health care provider or pharmacist before using salt substitutes. Do not fry foods. Cook foods using healthy methods such as baking, boiling, grilling, roasting, and broiling instead. Cook with heart-healthy oils, such as olive, canola, avocado, soybean, or sunflower oil. Meal planning  Eat a balanced diet that includes: 4 or more servings of fruits and 4 or more servings of vegetables each day. Try to fill one-half of your plate with fruits and vegetables. 6-8 servings of  whole grains each day. Less than 6 oz (170 g) of lean meat, poultry, or fish each day. A 3-oz (85-g) serving of meat is about the same size as a deck of cards. One egg equals 1 oz (28 g). 2-3 servings of low-fat dairy each day. One serving is 1 cup (237 mL). 1 serving of nuts, seeds, or beans 5 times each week. 2-3 servings of heart-healthy fats. Healthy fats called omega-3 fatty acids are found in foods such as walnuts, flaxseeds, fortified milks, and eggs. These fats are also found in cold-water fish, such as sardines, salmon, and mackerel. Limit how much you eat of: Canned or prepackaged foods. Food that is high in trans fat, such as some fried foods. Food that is high in saturated fat, such as fatty meat. Desserts and other sweets, sugary drinks, and other foods with added sugar. Full-fat dairy products. Do not salt foods before eating. Do not eat more than 4 egg yolks a week. Try to eat at least 2 vegetarian meals a week. Eat more home-cooked food and less restaurant, buffet, and fast food.  Lifestyle When eating at a restaurant, ask that your food be prepared with less salt or no salt, if possible. If you drink alcohol: Limit how much you use to: 0-1 drink a day for women who are not pregnant. 0-2 drinks a day for men. Be aware of how much alcohol is in your drink. In the [place] one drink equals one 12 oz bottle of beer (355 mL), one 5 oz glass  of wine (148 mL), or one 1 oz glass of hard liquor (44 mL). General information Avoid eating more than 2,300 mg of salt a day. If you have hypertension, you may need to reduce your sodium intake to 1,500 mg a day. Work with your health care provider to maintain a healthy body weight or to lose weight. Ask what an ideal weight is for you. Get at least 30 minutes of exercise that causes your heart to beat faster (aerobic exercise) most days of the week. Activities may include walking, swimming, or biking. Work with your health care provider or  dietitian to adjust your eating plan to your individual calorie needs. What foods should I eat? Fruits All fresh, dried, or frozen fruit. Canned fruit in natural juice (without addedsugar). Vegetables Fresh or frozen vegetables (raw, steamed, roasted, or grilled). Low-sodium or reduced-sodium tomato and vegetable juice. Low-sodium or reduced-sodium tomatosauce and tomato paste. Low-sodium or reduced-sodium canned vegetables. Grains Whole-grain or whole-wheat bread. Whole-grain or whole-wheat pasta. Brown rice. [place]. Bulgur. Whole-grain and low-sodium cereals. Pita bread.Low-fat, low-sodium crackers. Whole-wheat flour tortillas. Meats and other proteins Skinless chicken or [place]. Ground chicken or [place]. Pork with fat trimmed off. Fish and seafood. Egg whites. Dried beans, peas, or lentils. Unsalted nuts, nut butters, and seeds. Unsalted canned beans. Lean cuts of beef with fat trimmed off. Low-sodium, lean precooked or cured meat, such as sausages or meatloaves. Dairy Low-fat (1%) or fat-free (skim) milk. Reduced-fat, low-fat, or fat-free cheeses. Nonfat, low-sodium ricotta or cottage cheese. Low-fat or nonfatyogurt. Low-fat, low-sodium cheese. Fats and oils Soft margarine without trans fats. Vegetable oil. Reduced-fat, low-fat, or light mayonnaise and salad dressings (reduced-sodium). Canola, safflower, olive, avocado, soybean, andsunflower oils. Avocado. Seasonings and condiments Herbs. Spices. Seasoning mixes without salt. Other foods Unsalted popcorn and pretzels. Fat-free sweets. The items listed above may not be a complete list of foods and beverages you can eat. Contact a dietitian for more information. What foods should I avoid? Fruits Canned fruit in a light or heavy syrup. Fried fruit. Fruit in cream or buttersauce. Vegetables Creamed or fried vegetables. Vegetables in a cheese sauce. Regular canned vegetables (not low-sodium or reduced-sodium). Regular canned tomato  sauce and paste (not low-sodium or reduced-sodium). Regular tomato and vegetable juice(not low-sodium or reduced-sodium). Angie Fava. Olives. Grains Baked goods made with fat, such as croissants, muffins, or some breads. Drypasta or rice meal packs. Meats and other proteins Fatty cuts of meat. Ribs. Fried meat. Berniece Salines. Bologna, salami, and other precooked or cured meats, such as sausages or meat loaves. Fat from the back of a pig (fatback). Bratwurst. Salted nuts and seeds. Canned beans with added salt. Canned orsmoked fish. Whole eggs or egg yolks. Chicken or [place] with skin. Dairy Whole or 2% milk, cream, and half-and-half. Whole or full-fat cream cheese. Whole-fat or sweetened yogurt. Full-fat cheese. Nondairy creamers. Whippedtoppings. Processed cheese and cheese spreads. Fats and oils Butter. Stick margarine. Lard. Shortening. Ghee. Bacon fat. Tropical oils, suchas coconut, palm kernel, or palm oil. Seasonings and condiments Onion salt, garlic salt, seasoned salt, table salt, and sea salt. Worcestershire sauce. Tartar sauce. Barbecue sauce. Teriyaki sauce. Soy sauce, including reduced-sodium. Steak sauce. Canned and packaged gravies. Fish sauce. Oyster sauce. Cocktail sauce. Store-bought horseradish. Ketchup. Mustard. Meat flavorings and tenderizers. Bouillon cubes. Hot sauces. Pre-made or packaged marinades. Pre-made or packaged taco seasonings. Relishes. Regular saladdressings. Other foods Salted popcorn and pretzels. The items listed above may not be a complete list of foods and beverages you should avoid. Contact  a dietitian for more information. Where to find more information National Heart, Lung, and Blood Institute: https://wilson-eaton.com/ American Heart Association: www.heart.org Academy of Nutrition and Dietetics: www.eatright.[place]: www.kidney.org Summary The DASH eating plan is a healthy eating plan that has been shown to reduce high blood pressure (hypertension).  It may also reduce your risk for type 2 diabetes, heart disease, and stroke. When on the DASH eating plan, aim to eat more fresh fruits and vegetables, whole grains, lean proteins, low-fat dairy, and heart-healthy fats. With the DASH eating plan, you should limit salt (sodium) intake to 2,300 mg a day. If you have hypertension, you may need to reduce your sodium intake to 1,500 mg a day. Work with your health care provider or dietitian to adjust your eating plan to your individual calorie needs. This information is not intended to replace advice given to you by your health care provider. Make sure you discuss any questions you have with your healthcare provider. Document Revised: 07/01/2019 Document Reviewed: 07/01/2019 Elsevier Patient Education  2022 [employer].

## 2021-03-01 NOTE — Progress Notes (Signed)
Subjective:    Patient ID: Susan Lamb, female    DOB: 08/07/1961, 60 y.o.   MRN: [id number]  DOS:  03/01/2021 Type of visit - description: Acute Elevated BP. BP was noted to be elevated when she went for a colonoscopy visit last week. 171/105, 161/101. Since then she is start checking it at home, and it varies from 149/88-180/115. She admits has gained weight, approximately 20 pounds in the last 3 to 4 years. She is under stress as well.  Occasionally has a headache in the mornings that goes away after she drinks some coffee. No chest pain, no difficulty breathing or lower extremity edema. No palpitations She has been taking DayQuil at times. Denies taking any weight loss medication over-the-counter  Review of Systems See above   Past Medical History:  Diagnosis Date   Back pain    BV (bacterial vaginosis)    Contact lens/glasses fitting    Endometrial polyp    Fatigue    Fibroids    Hemorrhoids    Herpes    Menorrhagia    Ovarian cyst     Past Surgical History:  Procedure Laterality Date   ABDOMINAL HYSTERECTOMY  2000   DILATION AND CURETTAGE OF UTERUS  1998   due to two miscarriages   LAPAROSCOPIC GASTRIC BANDING  03 20 2012   MYOMECTOMY      Allergies as of 03/01/2021   No Known Allergies      Medication List        Accurate as of March 01, 2021 11:59 PM. If you have any questions, ask your nurse or doctor.          STOP taking these medications    meloxicam 7.5 MG tablet Commonly known as: MOBIC Stopped by: Kathlene November, MD       TAKE these medications    Biotin 1 MG Caps biotin   CALCIUM + D PO Take by mouth.   FLAX SEEDS PO Take 1 tablet by mouth daily.   Flaxseed-Eve Prim-Bilberry 1000-500-40 MG Caps flaxseed   losartan-hydrochlorothiazide 50-12.5 MG tablet Commonly known as: HYZAAR Take 1 tablet by mouth daily. Started by: Kathlene November, MD   Lysine 1000 MG Tabs lysine  take 2,000 po qd   omeprazole 20 MG capsule Commonly  known as: PRILOSEC Take 1 capsule (20 mg total) by mouth daily.   ONE-A-DAY VITACRAVES PO Take by mouth.   PROBIOTIC ADVANCED PO Take by mouth.   valACYclovir 500 MG tablet Commonly known as: VALTREX Take 500 mg by mouth as needed.   vitamin C 1000 MG tablet Take 1,000 mg by mouth daily.   VITAMIN D PO Take by mouth.           Objective:   Physical Exam BP (!) 152/100 (BP Location: Right Wrist, Patient Position: Sitting, Cuff Size: Small)   Pulse 96   Temp 98.3 F (36.8 C) (Oral)   Resp 18   Ht '5\' 3"'$  (1.6 m)   Wt 249 lb 8 oz (113.2 kg)   SpO2 98%   BMI 44.20 kg/m  General:   Well developed, NAD, BMI noted.  HEENT:  Normocephalic . Face symmetric, atraumatic Neck: No thyromegaly Lungs:  CTA B Normal respiratory effort, no intercostal retractions, no accessory muscle use. Heart: RRR,  no murmur.  Abdomen:  Not distended, soft, non-tender. No rebound or rigidity.   Skin: Not pale. Not jaundice Lower extremities: no pretibial edema bilaterally  Neurologic:  alert & oriented X3.  Speech  normal, gait appropriate for age and unassisted Psych--  Cognition and judgment appear intact.  Cooperative with normal attention span and concentration.  Behavior appropriate. No anxious or depressed appearing.     Assessment    60 year old female, PMH includes morbid obesity status post lap band, history of elevated BP without Dx of hypertension, presents with:  HTN: The patient has multiple documented readings of elevated BP, she is asymptomatic. Context: Weight gain in the last few years, + stress lately. Explained to patient that she has hypertension given multiple documented readings of elevated BP. Explained the patient the need for treatment, including low-salt diet, gradual exercise, medicines . EKG today: nsr, no acute Plan: CMP, CBC, TSH Start losartan HCT low-dose, 1 a day. Continue checking BPs Call if side effects See PCP in 2 weeks    This visit  occurred during the SARS-CoV-2 public health emergency.  Safety protocols were in place, including screening questions prior to the visit, additional usage of staff PPE, and extensive cleaning of exam room while observing appropriate contact time as indicated for disinfecting solutions.

## 2021-03-15 ENCOUNTER — Encounter: Payer: Self-pay | Admitting: *Deleted

## 2021-03-21 ENCOUNTER — Other Ambulatory Visit: Payer: Self-pay

## 2021-03-21 MED ORDER — LOSARTAN POTASSIUM-HCTZ 50-12.5 MG PO TABS: 1.0000 | ORAL_TABLET | Freq: Every day | ORAL | 0 refills | Status: DC

## 2021-03-22 ENCOUNTER — Ambulatory Visit: Payer: Managed Care, Other (non HMO) | Admitting: Family Medicine

## 2021-04-08 ENCOUNTER — Encounter: Payer: Self-pay | Admitting: Family Medicine

## 2021-04-08 ENCOUNTER — Ambulatory Visit (INDEPENDENT_AMBULATORY_CARE_PROVIDER_SITE_OTHER): Payer: Managed Care, Other (non HMO) | Admitting: Family Medicine

## 2021-04-08 ENCOUNTER — Other Ambulatory Visit: Payer: Self-pay

## 2021-04-08 VITALS — BP 110/80 | HR 100 | Temp 98.6°F | Resp 18 | Ht 63.0 in | Wt 242.4 lb

## 2021-04-08 DIAGNOSIS — E785 Hyperlipidemia, unspecified: Secondary | ICD-10-CM | POA: Diagnosis not present

## 2021-04-08 DIAGNOSIS — I10 Essential (primary) hypertension: Secondary | ICD-10-CM

## 2021-04-08 MED ORDER — LOSARTAN POTASSIUM-HCTZ 50-12.5 MG PO TABS: 1.0000 | ORAL_TABLET | Freq: Every day | ORAL | 1 refills | Status: DC

## 2021-04-08 NOTE — Patient Instructions (Signed)
https://www.nhlbi.nih.gov/files/docs/public/heart/dash_brief.pdf">  DASH Eating Plan DASH stands for Dietary Approaches to Stop Hypertension. The DASH eating plan is a healthy eating plan that has been shown to: Reduce high blood pressure (hypertension). Reduce your risk for type 2 diabetes, heart disease, and stroke. Help with weight loss. What are tips for following this plan? Reading food labels Check food labels for the amount of salt (sodium) per serving. Choose foods with less than 5 percent of the Daily Value of sodium. Generally, foods with less than 300 milligrams (mg) of sodium per serving fit into this eating plan. To find whole grains, look for the word "whole" as the first word in the ingredient list. Shopping Buy products labeled as "low-sodium" or "no salt added." Buy fresh foods. Avoid canned foods and pre-made or frozen meals. Cooking Avoid adding salt when cooking. Use salt-free seasonings or herbs instead of table salt or sea salt. Check with your health care provider or pharmacist before using salt substitutes. Do not fry foods. Cook foods using healthy methods such as baking, boiling, grilling, roasting, and broiling instead. Cook with heart-healthy oils, such as olive, canola, avocado, soybean, or sunflower oil. Meal planning  Eat a balanced diet that includes: 4 or more servings of fruits and 4 or more servings of vegetables each day. Try to fill one-half of your plate with fruits and vegetables. 6-8 servings of whole grains each day. Less than 6 oz (170 g) of lean meat, poultry, or fish each day. A 3-oz (85-g) serving of meat is about the same size as a deck of cards. One egg equals 1 oz (28 g). 2-3 servings of low-fat dairy each day. One serving is 1 cup (237 mL). 1 serving of nuts, seeds, or beans 5 times each week. 2-3 servings of heart-healthy fats. Healthy fats called omega-3 fatty acids are found in foods such as walnuts, flaxseeds, fortified milks, and eggs.  These fats are also found in cold-water fish, such as sardines, salmon, and mackerel. Limit how much you eat of: Canned or prepackaged foods. Food that is high in trans fat, such as some fried foods. Food that is high in saturated fat, such as fatty meat. Desserts and other sweets, sugary drinks, and other foods with added sugar. Full-fat dairy products. Do not salt foods before eating. Do not eat more than 4 egg yolks a week. Try to eat at least 2 vegetarian meals a week. Eat more home-cooked food and less restaurant, buffet, and fast food.  Lifestyle When eating at a restaurant, ask that your food be prepared with less salt or no salt, if possible. If you drink alcohol: Limit how much you use to: 0-1 drink a day for women who are not pregnant. 0-2 drinks a day for men. Be aware of how much alcohol is in your drink. In the [place] one drink equals one 12 oz bottle of beer (355 mL), one 5 oz glass of wine (148 mL), or one 1 oz glass of hard liquor (44 mL). General information Avoid eating more than 2,300 mg of salt a day. If you have hypertension, you may need to reduce your sodium intake to 1,500 mg a day. Work with your health care provider to maintain a healthy body weight or to lose weight. Ask what an ideal weight is for you. Get at least 30 minutes of exercise that causes your heart to beat faster (aerobic exercise) most days of the week. Activities may include walking, swimming, or biking. Work with your health care provider   or dietitian to adjust your eating plan to your individual calorie needs. What foods should I eat? Fruits All fresh, dried, or frozen fruit. Canned fruit in natural juice (without addedsugar). Vegetables Fresh or frozen vegetables (raw, steamed, roasted, or grilled). Low-sodium or reduced-sodium tomato and vegetable juice. Low-sodium or reduced-sodium tomatosauce and tomato paste. Low-sodium or reduced-sodium canned vegetables. Grains Whole-grain or  whole-wheat bread. Whole-grain or whole-wheat pasta. Brown rice. Oatmeal. Quinoa. Bulgur. Whole-grain and low-sodium cereals. Pita bread.Low-fat, low-sodium crackers. Whole-wheat flour tortillas. Meats and other proteins Skinless chicken or turkey. Ground chicken or turkey. Pork with fat trimmed off. Fish and seafood. Egg whites. Dried beans, peas, or lentils. Unsalted nuts, nut butters, and seeds. Unsalted canned beans. Lean cuts of beef with fat trimmed off. Low-sodium, lean precooked or cured meat, such as sausages or meatloaves. Dairy Low-fat (1%) or fat-free (skim) milk. Reduced-fat, low-fat, or fat-free cheeses. Nonfat, low-sodium ricotta or cottage cheese. Low-fat or nonfatyogurt. Low-fat, low-sodium cheese. Fats and oils Soft margarine without trans fats. Vegetable oil. Reduced-fat, low-fat, or light mayonnaise and salad dressings (reduced-sodium). Canola, safflower, olive, avocado, soybean, andsunflower oils. Avocado. Seasonings and condiments Herbs. Spices. Seasoning mixes without salt. Other foods Unsalted popcorn and pretzels. Fat-free sweets. The items listed above may not be a complete list of foods and beverages you can eat. Contact a dietitian for more information. What foods should I avoid? Fruits Canned fruit in a light or heavy syrup. Fried fruit. Fruit in cream or buttersauce. Vegetables Creamed or fried vegetables. Vegetables in a cheese sauce. Regular canned vegetables (not low-sodium or reduced-sodium). Regular canned tomato sauce and paste (not low-sodium or reduced-sodium). Regular tomato and vegetable juice(not low-sodium or reduced-sodium). Pickles. Olives. Grains Baked goods made with fat, such as croissants, muffins, or some breads. Drypasta or rice meal packs. Meats and other proteins Fatty cuts of meat. Ribs. Fried meat. Bacon. Bologna, salami, and other precooked or cured meats, such as sausages or meat loaves. Fat from the back of a pig (fatback). Bratwurst.  Salted nuts and seeds. Canned beans with added salt. Canned orsmoked fish. Whole eggs or egg yolks. Chicken or turkey with skin. Dairy Whole or 2% milk, cream, and half-and-half. Whole or full-fat cream cheese. Whole-fat or sweetened yogurt. Full-fat cheese. Nondairy creamers. Whippedtoppings. Processed cheese and cheese spreads. Fats and oils Butter. Stick margarine. Lard. Shortening. Ghee. Bacon fat. Tropical oils, suchas coconut, palm kernel, or palm oil. Seasonings and condiments Onion salt, garlic salt, seasoned salt, table salt, and sea salt. Worcestershire sauce. Tartar sauce. Barbecue sauce. Teriyaki sauce. Soy sauce, including reduced-sodium. Steak sauce. Canned and packaged gravies. Fish sauce. Oyster sauce. Cocktail sauce. Store-bought horseradish. Ketchup. Mustard. Meat flavorings and tenderizers. Bouillon cubes. Hot sauces. Pre-made or packaged marinades. Pre-made or packaged taco seasonings. Relishes. Regular saladdressings. Other foods Salted popcorn and pretzels. The items listed above may not be a complete list of foods and beverages you should avoid. Contact a dietitian for more information. Where to find more information National Heart, Lung, and Blood Institute: www.nhlbi.nih.gov American Heart Association: www.heart.org Academy of Nutrition and Dietetics: www.eatright.org National Kidney Foundation: www.kidney.org Summary The DASH eating plan is a healthy eating plan that has been shown to reduce high blood pressure (hypertension). It may also reduce your risk for type 2 diabetes, heart disease, and stroke. When on the DASH eating plan, aim to eat more fresh fruits and vegetables, whole grains, lean proteins, low-fat dairy, and heart-healthy fats. With the DASH eating plan, you should limit salt (sodium) intake to 2,300   mg a day. If you have hypertension, you may need to reduce your sodium intake to 1,500 mg a day. Work with your health care provider or dietitian to adjust  your eating plan to your individual calorie needs. This information is not intended to replace advice given to you by your health care provider. Make sure you discuss any questions you have with your healthcare provider. Document Revised: 07/01/2019 Document Reviewed: 07/01/2019 Elsevier Patient Education  2022 [employer]  

## 2021-04-08 NOTE — Assessment & Plan Note (Signed)
Well controlled, no changes to meds. Encouraged heart healthy diet such as the DASH diet and exercise as tolerated.  °

## 2021-04-08 NOTE — Progress Notes (Signed)
Established Patient Office Visit  Subjective:  Patient ID: Susan Lamb, female    DOB: October 13, 1960  Age: 60 y.o. MRN: [id number]  CC:  Chief Complaint  Patient presents with   Hypertension    Pt states taking bp medication. Pt state bp at home around 114/68 to 130/80   Follow-up    HPI  Susan Lamb presents for bp-----pt is not having an problems with the medication   Past Medical History:  Diagnosis Date   Back pain    BV (bacterial vaginosis)    Contact lens/glasses fitting    Endometrial polyp    Fatigue    Fibroids    Hemorrhoids    Herpes    Menorrhagia    Ovarian cyst     Past Surgical History:  Procedure Laterality Date   ABDOMINAL HYSTERECTOMY  2000   DILATION AND CURETTAGE OF UTERUS  1998   due to two miscarriages   LAPAROSCOPIC GASTRIC BANDING  03 20 2012   MYOMECTOMY      Family History  Problem Relation Age of Onset   Stroke Mother    Heart disease Mother    Hypertension Father     Social History   Socioeconomic History   Marital status: Single    Spouse name: Not on file   Number of children: Not on file   Years of education: Not on file   Highest education level: Not on file  Occupational History   Not on file  Tobacco Use   Smoking status: Never   Smokeless tobacco: Never  Substance and Sexual Activity   Alcohol use: Yes    Comment: 1 or 2 socially   Drug use: No   Sexual activity: Yes    Birth control/protection: Surgical    Comment: hysterectomy  Other Topics Concern   Not on file  Social History Narrative   Not on file   Social Determinants of Health   Financial Resource Strain: Not on file  Food Insecurity: Not on file  Transportation Needs: Not on file  Physical Activity: Not on file  Stress: Not on file  Social Connections: Not on file  Intimate Partner Violence: Not on file    Outpatient Medications Prior to Visit  Medication Sig  Dispense Refill   Ascorbic Acid (VITAMIN C) 1000 MG tablet Take 1,000 mg by mouth daily.     Biotin 1 MG CAPS biotin     Calcium Carbonate-Vitamin D (CALCIUM + D PO) Take by mouth.       Flaxseed, Linseed, (FLAX SEEDS PO) Take 1 tablet by mouth daily.     Flaxseed-Eve Prim-Bilberry 1000-500-40 MG CAPS flaxseed     Lysine 1000 MG TABS lysine  take 2,000 po qd     Multiple Vitamins-Minerals (ONE-A-DAY VITACRAVES PO) Take by mouth.       omeprazole (PRILOSEC) 20 MG capsule Take 1 capsule (20 mg total) by mouth daily. 30 capsule 3   Probiotic Product (PROBIOTIC ADVANCED PO) Take by mouth.     valACYclovir (VALTREX) 500 MG tablet Take 500 mg by mouth as needed.      VITAMIN D PO Take by mouth.     losartan-hydrochlorothiazide (HYZAAR) 50-12.5 MG tablet Take 1 tablet by mouth daily. 30 tablet 0   No  facility-administered medications prior to visit.    No Known Allergies  ROS Review of Systems  Constitutional:  Negative for appetite change, diaphoresis, fatigue and unexpected weight change.  Eyes:  Negative for pain, redness and visual disturbance.  Respiratory:  Negative for cough, chest tightness, shortness of breath and wheezing.   Cardiovascular:  Negative for chest pain, palpitations and leg swelling.  Endocrine: Negative for cold intolerance, heat intolerance, polydipsia, polyphagia and polyuria.  Genitourinary:  Negative for difficulty urinating, dysuria and frequency.  Neurological:  Negative for dizziness, light-headedness, numbness and headaches.     Objective:    Physical Exam Vitals and nursing note reviewed.  Constitutional:      Appearance: She is well-developed.  HENT:     Head: Normocephalic and atraumatic.  Eyes:     Conjunctiva/sclera: Conjunctivae normal.  Neck:     Thyroid: No thyromegaly.     Vascular: No carotid bruit or JVD.  Cardiovascular:     Rate and Rhythm: Normal rate and regular rhythm.     Heart sounds: Normal heart sounds. No murmur  heard. Pulmonary:     Effort: Pulmonary effort is normal. No respiratory distress.     Breath sounds: Normal breath sounds. No wheezing or rales.  Chest:     Chest wall: No tenderness.  Musculoskeletal:     Cervical back: Normal range of motion and neck supple.  Neurological:     Mental Status: She is alert and oriented to person, place, and time.    BP 110/80 (BP Location: Left Arm, Patient Position: Sitting, Cuff Size: Large)   Pulse 100   Temp 98.6 F (37 C) (Oral)   Resp 18   Ht '5\' 3"'$  (1.6 m)   Wt 242 lb 6.4 oz (110 kg)   SpO2 99%   BMI 42.94 kg/m  Wt Readings from Last 3 Encounters:  04/08/21 242 lb 6.4 oz (110 kg)  03/01/21 249 lb 8 oz (113.2 kg)  03/02/20 (!) 237 lb 9.6 oz (107.8 kg)     Health Maintenance Due  Topic Date Due   COVID-19 Vaccine (2 - Pfizer series) 06/14/2020   INFLUENZA VACCINE  03/11/2021    There are no preventive care reminders to display for this patient.  Lab Results  Component Value Date   TSH 2.26 03/01/2021   Lab Results  Component Value Date   WBC 7.4 03/01/2021   HGB 12.6 03/01/2021   HCT 37.4 03/01/2021   MCV 90.1 03/01/2021   PLT 345 03/01/2021   Lab Results  Component Value Date   NA 140 03/01/2021   K 4.3 03/01/2021   CO2 27 03/01/2021   GLUCOSE 83 03/01/2021   BUN 11 03/01/2021   CREATININE 0.81 03/01/2021   BILITOT 0.4 03/01/2021   ALKPHOS 87 08/30/2018   AST 19 03/01/2021   ALT 16 03/01/2021   PROT 7.5 03/01/2021   ALBUMIN 3.9 08/30/2018   CALCIUM 9.5 03/01/2021   GFR 88.68 08/30/2018   Lab Results  Component Value Date   CHOL 211 (H) 03/02/2020   Lab Results  Component Value Date   HDL 62.10 03/02/2020   Lab Results  Component Value Date   LDLCALC 138 (H) 03/02/2020   Lab Results  Component Value Date   TRIG 56.0 03/02/2020   Lab Results  Component Value Date   CHOLHDL 3 03/02/2020   No results found for: HGBA1C    Assessment & Plan:   Problem List Items Addressed This Visit  Unprioritized   Hyperlipidemia    Encourage heart healthy diet such as MIND or DASH diet, increase exercise, avoid trans fats, simple carbohydrates and processed foods, consider a krill or fish or flaxseed oil cap daily.       Relevant Medications   losartan-hydrochlorothiazide (HYZAAR) 50-12.5 MG tablet   Other Relevant Orders   Lipid panel   Comprehensive metabolic panel   Primary hypertension - Primary    Well controlled, no changes to meds. Encouraged heart healthy diet such as the DASH diet and exercise as tolerated.       Relevant Medications   losartan-hydrochlorothiazide (HYZAAR) 50-12.5 MG tablet   Other Relevant Orders   Lipid panel   Comprehensive metabolic panel    Meds ordered this encounter  Medications   losartan-hydrochlorothiazide (HYZAAR) 50-12.5 MG tablet    Sig: Take 1 tablet by mouth daily.    Dispense:  90 tablet    Refill:  1     Follow-up: Return in 3 months (on 07/09/2021), or if symptoms worsen or fail to improve.    Ann Held, DO

## 2021-04-08 NOTE — Assessment & Plan Note (Signed)
Encourage heart healthy diet such as MIND or DASH diet, increase exercise, avoid trans fats, simple carbohydrates and processed foods, consider a krill or fish or flaxseed oil cap daily.  °

## 2021-04-09 ENCOUNTER — Other Ambulatory Visit: Payer: Self-pay | Admitting: Family Medicine

## 2021-04-09 DIAGNOSIS — E785 Hyperlipidemia, unspecified: Secondary | ICD-10-CM

## 2021-04-09 LAB — COMPREHENSIVE METABOLIC PANEL
ALT: 16 U/L (ref 0–35)
AST: 20 U/L (ref 0–37)
Albumin: 4 g/dL (ref 3.5–5.2)
Alkaline Phosphatase: 84 U/L (ref 39–117)
BUN: 13 mg/dL (ref 6–23)
CO2: 28 mEq/L (ref 19–32)
Calcium: 9.6 mg/dL (ref 8.4–10.5)
Chloride: 99 mEq/L (ref 96–112)
Creatinine, Ser: 0.82 mg/dL (ref 0.40–1.20)
GFR: 79.05 mL/min (ref >60.00)
Glucose, Bld: 114 mg/dL — ABNORMAL HIGH (ref 70–99)
Potassium: 3.7 mEq/L (ref 3.5–5.1)
Sodium: 136 mEq/L (ref 135–145)
Total Bilirubin: 0.5 mg/dL (ref 0.2–1.2)
Total Protein: 7.8 g/dL (ref 6.0–8.3)

## 2021-04-09 LAB — LIPID PANEL
Cholesterol: 242 mg/dL — ABNORMAL HIGH (ref 0–200)
HDL: 58.2 mg/dL (ref >39.00)
LDL Cholesterol: 165 mg/dL — ABNORMAL HIGH (ref 0–99)
NonHDL: 184.2
Total CHOL/HDL Ratio: 4
Triglycerides: 95 mg/dL (ref 0.0–149.0)
VLDL: 19 mg/dL (ref 0.0–40.0)

## 2021-07-09 ENCOUNTER — Ambulatory Visit: Payer: Managed Care, Other (non HMO) | Admitting: Family Medicine

## 2022-05-02 ENCOUNTER — Other Ambulatory Visit: Payer: Self-pay | Admitting: Family Medicine

## 2022-05-02 DIAGNOSIS — I10 Essential (primary) hypertension: Secondary | ICD-10-CM
